# Patient Record
Sex: Female | Born: 1975 | Race: White | Hispanic: No | Marital: Married | Smoking: Never smoker
Health system: Southern US, Community
[De-identification: ages and names within clinical notes are randomized; demographics above are authoritative.]

## PROBLEM LIST (undated history)

## (undated) DIAGNOSIS — E079 Disorder of thyroid, unspecified: Secondary | ICD-10-CM

## (undated) DIAGNOSIS — E7800 Pure hypercholesterolemia, unspecified: Secondary | ICD-10-CM

## (undated) DIAGNOSIS — R030 Elevated blood-pressure reading, without diagnosis of hypertension: Secondary | ICD-10-CM

## (undated) DIAGNOSIS — T7840XA Allergy, unspecified, initial encounter: Secondary | ICD-10-CM

## (undated) DIAGNOSIS — F419 Anxiety disorder, unspecified: Secondary | ICD-10-CM

## (undated) DIAGNOSIS — I10 Essential (primary) hypertension: Secondary | ICD-10-CM

## (undated) DIAGNOSIS — G4730 Sleep apnea, unspecified: Secondary | ICD-10-CM

## (undated) DIAGNOSIS — J302 Other seasonal allergic rhinitis: Secondary | ICD-10-CM

## (undated) DIAGNOSIS — K635 Polyp of colon: Secondary | ICD-10-CM

## (undated) DIAGNOSIS — G8929 Other chronic pain: Secondary | ICD-10-CM

## (undated) DIAGNOSIS — K219 Gastro-esophageal reflux disease without esophagitis: Secondary | ICD-10-CM

## (undated) DIAGNOSIS — J45909 Unspecified asthma, uncomplicated: Secondary | ICD-10-CM

## (undated) DIAGNOSIS — R002 Palpitations: Secondary | ICD-10-CM

## (undated) DIAGNOSIS — D219 Benign neoplasm of connective and other soft tissue, unspecified: Secondary | ICD-10-CM

## (undated) DIAGNOSIS — J309 Allergic rhinitis, unspecified: Secondary | ICD-10-CM

## (undated) DIAGNOSIS — K5790 Diverticulosis of intestine, part unspecified, without perforation or abscess without bleeding: Secondary | ICD-10-CM

## (undated) DIAGNOSIS — R011 Cardiac murmur, unspecified: Secondary | ICD-10-CM

## (undated) DIAGNOSIS — D649 Anemia, unspecified: Secondary | ICD-10-CM

## (undated) DIAGNOSIS — T783XXA Angioneurotic edema, initial encounter: Secondary | ICD-10-CM

## (undated) DIAGNOSIS — R519 Headache, unspecified: Secondary | ICD-10-CM

## (undated) DIAGNOSIS — R51 Headache: Secondary | ICD-10-CM

## (undated) DIAGNOSIS — K589 Irritable bowel syndrome without diarrhea: Secondary | ICD-10-CM

## (undated) DIAGNOSIS — Z860101 Personal history of adenomatous and serrated colon polyps: Secondary | ICD-10-CM

## (undated) DIAGNOSIS — Z86010 Personal history of colonic polyps: Secondary | ICD-10-CM

## (undated) DIAGNOSIS — L509 Urticaria, unspecified: Secondary | ICD-10-CM

## (undated) HISTORY — DX: Polyp of colon: K63.5

## (undated) HISTORY — DX: Gastro-esophageal reflux disease without esophagitis: K21.9

## (undated) HISTORY — DX: Essential (primary) hypertension: I10

## (undated) HISTORY — DX: Headache: R51

## (undated) HISTORY — DX: Headache, unspecified: R51.9

## (undated) HISTORY — DX: Anemia, unspecified: D64.9

## (undated) HISTORY — DX: Elevated blood-pressure reading, without diagnosis of hypertension: R03.0

## (undated) HISTORY — DX: Allergy, unspecified, initial encounter: T78.40XA

## (undated) HISTORY — DX: Irritable bowel syndrome, unspecified: K58.9

## (undated) HISTORY — DX: Palpitations: R00.2

## (undated) HISTORY — DX: Anxiety disorder, unspecified: F41.9

## (undated) HISTORY — DX: Diverticulosis of intestine, part unspecified, without perforation or abscess without bleeding: K57.90

## (undated) HISTORY — DX: Cardiac murmur, unspecified: R01.1

## (undated) HISTORY — DX: Urticaria, unspecified: L50.9

## (undated) HISTORY — DX: Angioneurotic edema, initial encounter: T78.3XXA

## (undated) HISTORY — DX: Personal history of colonic polyps: Z86.010

## (undated) HISTORY — PX: WISDOM TOOTH EXTRACTION: SHX21

## (undated) HISTORY — DX: Other seasonal allergic rhinitis: J30.2

## (undated) HISTORY — DX: Benign neoplasm of connective and other soft tissue, unspecified: D21.9

## (undated) HISTORY — DX: Unspecified asthma, uncomplicated: J45.909

## (undated) HISTORY — DX: Other chronic pain: G89.29

## (undated) HISTORY — DX: Sleep apnea, unspecified: G47.30

## (undated) HISTORY — DX: Personal history of adenomatous and serrated colon polyps: Z86.0101

## (undated) HISTORY — DX: Allergic rhinitis, unspecified: J30.9

---

## 1998-06-26 ENCOUNTER — Encounter: Payer: Self-pay | Admitting: Family Medicine

## 1998-06-26 ENCOUNTER — Ambulatory Visit (HOSPITAL_COMMUNITY): Admission: RE | Admit: 1998-06-26 | Discharge: 1998-06-26 | Payer: Self-pay | Admitting: Family Medicine

## 1998-12-23 ENCOUNTER — Other Ambulatory Visit: Admission: RE | Admit: 1998-12-23 | Discharge: 1998-12-23 | Payer: Self-pay | Admitting: Obstetrics and Gynecology

## 1999-09-02 ENCOUNTER — Emergency Department (HOSPITAL_COMMUNITY): Admission: EM | Admit: 1999-09-02 | Discharge: 1999-09-02 | Payer: Self-pay | Admitting: Emergency Medicine

## 1999-09-02 ENCOUNTER — Encounter: Payer: Self-pay | Admitting: Emergency Medicine

## 2000-03-31 ENCOUNTER — Other Ambulatory Visit: Admission: RE | Admit: 2000-03-31 | Discharge: 2000-03-31 | Payer: Self-pay | Admitting: *Deleted

## 2000-12-07 ENCOUNTER — Encounter: Payer: Self-pay | Admitting: Family Medicine

## 2000-12-07 ENCOUNTER — Encounter: Admission: RE | Admit: 2000-12-07 | Discharge: 2000-12-07 | Payer: Self-pay | Admitting: Family Medicine

## 2001-04-09 ENCOUNTER — Other Ambulatory Visit: Admission: RE | Admit: 2001-04-09 | Discharge: 2001-04-09 | Payer: Self-pay | Admitting: Obstetrics and Gynecology

## 2001-06-15 ENCOUNTER — Ambulatory Visit (HOSPITAL_COMMUNITY): Admission: RE | Admit: 2001-06-15 | Discharge: 2001-06-15 | Payer: Self-pay | Admitting: Obstetrics & Gynecology

## 2002-05-10 ENCOUNTER — Other Ambulatory Visit: Admission: RE | Admit: 2002-05-10 | Discharge: 2002-05-10 | Payer: Self-pay | Admitting: Obstetrics & Gynecology

## 2003-03-12 ENCOUNTER — Other Ambulatory Visit: Admission: RE | Admit: 2003-03-12 | Discharge: 2003-03-12 | Payer: Self-pay | Admitting: Obstetrics and Gynecology

## 2003-03-27 ENCOUNTER — Ambulatory Visit (HOSPITAL_COMMUNITY): Admission: RE | Admit: 2003-03-27 | Discharge: 2003-03-27 | Payer: Self-pay | Admitting: Obstetrics & Gynecology

## 2003-05-23 ENCOUNTER — Inpatient Hospital Stay (HOSPITAL_COMMUNITY): Admission: AD | Admit: 2003-05-23 | Discharge: 2003-05-23 | Payer: Self-pay | Admitting: Obstetrics & Gynecology

## 2003-05-26 ENCOUNTER — Inpatient Hospital Stay (HOSPITAL_COMMUNITY): Admission: AD | Admit: 2003-05-26 | Discharge: 2003-06-02 | Payer: Self-pay | Admitting: Obstetrics & Gynecology

## 2004-03-17 ENCOUNTER — Other Ambulatory Visit: Admission: RE | Admit: 2004-03-17 | Discharge: 2004-03-17 | Payer: Self-pay | Admitting: Obstetrics and Gynecology

## 2004-10-06 ENCOUNTER — Other Ambulatory Visit: Admission: RE | Admit: 2004-10-06 | Discharge: 2004-10-06 | Payer: Self-pay | Admitting: Obstetrics and Gynecology

## 2005-02-24 ENCOUNTER — Observation Stay (HOSPITAL_COMMUNITY): Admission: AD | Admit: 2005-02-24 | Discharge: 2005-02-25 | Payer: Self-pay | Admitting: Obstetrics & Gynecology

## 2005-09-14 ENCOUNTER — Inpatient Hospital Stay (HOSPITAL_COMMUNITY): Admission: AD | Admit: 2005-09-14 | Discharge: 2005-09-17 | Payer: Self-pay | Admitting: Obstetrics and Gynecology

## 2007-06-29 ENCOUNTER — Ambulatory Visit: Payer: Self-pay | Admitting: Cardiology

## 2009-03-27 ENCOUNTER — Encounter: Admission: RE | Admit: 2009-03-27 | Discharge: 2009-03-27 | Payer: Self-pay | Admitting: Obstetrics and Gynecology

## 2010-06-12 ENCOUNTER — Encounter: Payer: Self-pay | Admitting: Obstetrics & Gynecology

## 2010-06-12 ENCOUNTER — Encounter: Payer: Self-pay | Admitting: Obstetrics and Gynecology

## 2011-03-29 DIAGNOSIS — F411 Generalized anxiety disorder: Secondary | ICD-10-CM | POA: Insufficient documentation

## 2012-02-07 ENCOUNTER — Other Ambulatory Visit: Payer: Self-pay | Admitting: Obstetrics and Gynecology

## 2012-02-07 DIAGNOSIS — N63 Unspecified lump in unspecified breast: Secondary | ICD-10-CM

## 2012-02-10 ENCOUNTER — Ambulatory Visit
Admission: RE | Admit: 2012-02-10 | Discharge: 2012-02-10 | Disposition: A | Discharge status: Home / Self Care | Payer: BC Managed Care – PPO | Source: Ambulatory Visit | Attending: Obstetrics and Gynecology | Admitting: Obstetrics and Gynecology

## 2012-02-10 DIAGNOSIS — N63 Unspecified lump in unspecified breast: Secondary | ICD-10-CM

## 2012-11-16 ENCOUNTER — Encounter: Payer: BC Managed Care – PPO | Admitting: Obstetrics and Gynecology

## 2012-12-13 ENCOUNTER — Encounter: Payer: BC Managed Care – PPO | Admitting: Obstetrics and Gynecology

## 2012-12-27 ENCOUNTER — Encounter: Payer: BC Managed Care – PPO | Admitting: Obstetrics and Gynecology

## 2013-01-09 ENCOUNTER — Encounter: Payer: Self-pay | Admitting: Obstetrics and Gynecology

## 2013-01-09 ENCOUNTER — Ambulatory Visit (INDEPENDENT_AMBULATORY_CARE_PROVIDER_SITE_OTHER): Payer: BC Managed Care – PPO | Admitting: Obstetrics and Gynecology

## 2013-01-09 VITALS — BP 128/80 | HR 70 | Ht 66.5 in | Wt 173.5 lb

## 2013-01-09 DIAGNOSIS — Z01419 Encounter for gynecological examination (general) (routine) without abnormal findings: Secondary | ICD-10-CM

## 2013-01-09 DIAGNOSIS — Z0000 Encounter for general adult medical examination without abnormal findings: Secondary | ICD-10-CM

## 2013-01-09 DIAGNOSIS — N920 Excessive and frequent menstruation with regular cycle: Secondary | ICD-10-CM

## 2013-01-09 LAB — POCT URINALYSIS DIPSTICK
Blood, UA: NEGATIVE
Glucose, UA: NEGATIVE
Urobilinogen, UA: NEGATIVE
pH, UA: 5

## 2013-01-09 MED ORDER — ETONOGESTREL-ETHINYL ESTRADIOL 0.12-0.015 MG/24HR VA RING: VAGINAL_RING | VAGINAL | Status: DC

## 2013-01-09 NOTE — Progress Notes (Signed)
Patient ID: Lynn Spears, female   DOB: 18-Oct-1975, 37 y.o.   MRN: [id number]  37 y.o.   Married    Caucasian   female   G1P1001   here for annual exam.   Went off NuvaRing for 6 months and tried for pregnancy but did not conceive. Still considering it.  Patient achieved pregnancy after had hysterosalpingogram in past.  Takes a multivitamin MDR for women.  Orders it online.    Menses are getting heavier.  Bleeds through a super tampon every three to four hours.  Some cramping, worse when she has a bowel movement.  Cramping is manageable.   Takes NSAIDs already during menses and this lessens the bleeding and cramping but lengthens the cycle.    Patient had a breast ultrasound and mammogram of the left breast for a breast lump. Told it was normal and recommended a mammogram at age 52 year old.  Patient states she still feels something there.    Notes a rash on her chest for one week. Did preschool screening the beginning of August.  Has a history of swollen glands periauricular.  Has had MRIs and scans which are normal.    Patient's last menstrual period was 12/16/2012.          Sexually active: yes  The current method of family planning is NuvaRing vaginal inserts.    Exercising: walking Last mammogram:  2013 wnl:[facility] Last pap smear: 10/2011 WNL and high risk HPV negative. History of abnormal pap: no Smoking: no Alcohol: 2 glasses of wine per week. Last colonoscopy: never Last Bone Density:  never Last tetanus shot: within last 10 years with PCP Last cholesterol check: 10/2011 wnl:[facility]  Hgb - 12.7            Urine: Neg   Family History  Problem Relation Age of Onset  . Hypertension Mother   . Ovarian cancer Maternal Aunt   . Diabetes Maternal Grandmother   . Stroke Maternal Grandmother     There are no active problems to display for this patient.   History reviewed. No pertinent past medical history.  History reviewed. No pertinent past surgical  history.  Allergies: Biaxin; Erythromycin; and Penicillins  Current Outpatient Prescriptions  Medication Sig Dispense Refill  . cetirizine (ZYRTEC) 10 MG tablet Take 10 mg by mouth daily.      Marland Kitchen etonogestrel-ethinyl estradiol (NUVARING) 0.12-0.015 MG/24HR vaginal ring Place 1 each vaginally every 28 (twenty-eight) days. Insert vaginally and leave in place for 3 consecutive weeks, then remove for 1 week.       No current facility-administered medications for this visit.    ROS: Pertinent items are noted in HPI.  Social Hx:  Married.  Environmental health practitioner.  One child - daughter - Cathlean Sauer.  Exam:    BP 128/80  Pulse 70  Ht 5' 6.5" (1.689 m)  Wt 173 lb 8 oz (78.699 kg)  BMI 27.59 kg/m2  LMP 12/16/2012   Wt Readings from Last 3 Encounters:  01/09/13 173 lb 8 oz (78.699 kg)     Ht Readings from Last 3 Encounters:  01/09/13 5' 6.5" (1.689 m)    General appearance: alert, cooperative and appears stated age Head: Normocephalic, without obvious abnormality, atraumatic Neck: no adenopathy, supple, symmetrical, trachea midline and thyroid not enlarged, symmetric, no tenderness/mass/nodules Lungs: clear to auscultation bilaterally Breasts: Inspection negative, No nipple retraction or dimpling, No nipple discharge or bleeding, No axillary or supraclavicular adenopathy, Normal to palpation without dominant masses Heart:  regular rate and rhythm Abdomen: soft, non-tender; no masses,  no organomegaly Extremities: extremities normal, atraumatic, no cyanosis or edema Skin: Skin color, texture, turgor normal. Patchy rash on chest.  Lymph nodes: Cervical, supraclavicular, and axillary nodes normal. No abnormal inguinal nodes palpated Neurologic: Grossly normal   Pelvic: External genitalia:  no lesions              Urethra:  normal appearing urethra with no masses, tenderness or lesions              Bartholins and Skenes: normal                 Vagina: normal appearing vagina with normal  color and discharge, no lesions              Cervix: normal appearance              Pap taken: no        Bimanual Exam:  Uterus:  uterus is normal size, shape, consistency and nontender.  Ring palpable in the vagina.                                       Adnexa: normal adnexa in size, nontender and no masses                                        Assessment  Normal gynecologic exam. Menorrhagia. Considering future fertility. AMA status.  Plan  Pap and HPV testing in 2018. Continue self breast exam. Hgb today. Use NuvaRing continually, changing every 3 weeks for three rings in a row.  Then have menses. If decide for pregnancy, start prenatal vitamins, stop NuvaRing and prevent pregnancy for at least 2 months before trying for preganancy, and consider using the LH ovulation kits along with timed intercourse. Discussed AMA status with patient and the implications of delaying trying for future fertility - increased risk of aneuploidy, increasing difficulty with conception, gestational diabetes, hypertension. Next annual exam in one year.   An After Visit Summary was printed and given to the patient.

## 2013-01-09 NOTE — Patient Instructions (Signed)

## 2013-10-23 ENCOUNTER — Telehealth: Payer: Self-pay | Admitting: Obstetrics and Gynecology

## 2013-10-23 NOTE — Telephone Encounter (Signed)
Message left to return call to Kita at [phone].    

## 2013-10-23 NOTE — Telephone Encounter (Signed)
Patient is having some pain and is requesting an appointment with Dr.Silva.

## 2013-10-23 NOTE — Telephone Encounter (Signed)
Spoke with patient. She states she has a new area of pain near urethra that patient describes as a "white open ulcer area." Noticed it with pain at area after intercourse on Sunday.  Using sitz bath at home with warm salt water without relief. Denies fevers or vaginal discharge. Patient requests appointment with Dr. Quincy Simmonds. Given first available appointment with Dr. Quincy Simmonds for 10/24/13 at 2:30. Patient is agreeable. She will call back if symptoms change or if worsens suggested urgent care or call back to our office.   Routing to provider for final review. Patient agreeable to disposition. Will close encounter

## 2013-10-24 ENCOUNTER — Ambulatory Visit (INDEPENDENT_AMBULATORY_CARE_PROVIDER_SITE_OTHER): Payer: BC Managed Care – PPO | Admitting: Obstetrics and Gynecology

## 2013-10-24 ENCOUNTER — Telehealth: Payer: Self-pay | Admitting: Obstetrics and Gynecology

## 2013-10-24 ENCOUNTER — Encounter: Payer: Self-pay | Admitting: Obstetrics and Gynecology

## 2013-10-24 VITALS — BP 122/80 | HR 70 | Ht 67.0 in | Wt 177.0 lb

## 2013-10-24 DIAGNOSIS — Z309 Encounter for contraceptive management, unspecified: Secondary | ICD-10-CM

## 2013-10-24 DIAGNOSIS — N766 Ulceration of vulva: Secondary | ICD-10-CM

## 2013-10-24 DIAGNOSIS — Z0000 Encounter for general adult medical examination without abnormal findings: Secondary | ICD-10-CM

## 2013-10-24 LAB — POCT URINALYSIS DIPSTICK
Bilirubin, UA: NEGATIVE
GLUCOSE UA: NEGATIVE
Ketones, UA: NEGATIVE
Leukocytes, UA: NEGATIVE
NITRITE UA: NEGATIVE
PROTEIN UA: NEGATIVE
RBC UA: NEGATIVE
UROBILINOGEN UA: NEGATIVE
pH, UA: 5

## 2013-10-24 MED ORDER — NORETHINDRONE 0.35 MG PO TABS: 1.0000 | ORAL_TABLET | Freq: Every day | ORAL | Status: DC

## 2013-10-24 MED ORDER — LIDOCAINE HCL 2 % EX GEL: 1.0000 "application " | Freq: Three times a day (TID) | CUTANEOUS | Status: DC

## 2013-10-24 MED ORDER — VALACYCLOVIR HCL 1 G PO TABS: 1000.0000 mg | ORAL_TABLET | Freq: Two times a day (BID) | ORAL | Status: DC

## 2013-10-24 NOTE — Telephone Encounter (Signed)
Spoke with patient. She feels she is healing and still using sitz bath with epsom salts but not "100%" Better. I advised patient that it is here choice to keep appointment or not, but as she described symptoms yesterday to me, I feel it is appropriate for appointment. Patient states she will keep appointment and she would like to be examined and know what is causing pain.   Appointment on schedule remains.   Routing to provider for final review. Patient agreeable to disposition. Will close encounter

## 2013-10-24 NOTE — Telephone Encounter (Signed)
Patient has an appointment today at 2:30 with Dr.Silva. Patient thinks she may not need this appointment. Patient says "It seems to getting better". Patient wants to schedule for next week then call and cancel if she feels better. I did not cancel her appointment today. I told her a triage nurse would need to call her back.

## 2013-10-24 NOTE — Progress Notes (Signed)
Patient ID: Lynn Spears, female   DOB: May 15, 1976, 38 y.o.   MRN: [id number] GYNECOLOGY VISIT  PCP:   Dr. Quintella Baton, [place]  Referring provider:   HPI: 38 y.o.   Married  Caucasian  female   G1P1001 with Patient's last menstrual period was 10/10/2013.   here for vulvar ulcer.   Painful intercourse starting last weekend.  Partner saw an ulcerative area near the clitoris.  Placing Epsom salts and feels better.  Using Aquafor vaseline like ointment. Never happened before.  Some discharge. No itching.  No odor.  No flu like symptoms.  New detergent change one month ago.  Does have a history of cold sores.   Had an episode of trigeminal neuralgia many years ago and the etiology was thought to be viral.  Received Rx or acyclovir.   Seen gastroenterologist for GI issues and blood pressure was noted to be elevated repeatedly. Colonoscopy not done yet.  BP 154/104. Had a full work up, and was placed on Atenolol. Will see Cardiologist on 11/05/13. Stopped NuvaRing due to elevated blood pressure.  Took Micronor in the past without problems.  May need to return to this.   Urine:  Neg  GYNECOLOGIC HISTORY: Patient's last menstrual period was 10/10/2013. Sexually active: yes Partner preference: female Contraception:  Abstinence--stopped Nuvaring recently due to elevated B/P  Menopausal hormone therapy: n/a DES exposure:  no Blood transfusions:  no Sexually transmitted diseases:  no  GYN procedures and prior surgeries:  no Last mammogram: Diagnostic MMG of Left breast 02-10-12 revealed dense, asymmestrical breast but no masses--Ultrasound confirmed.  Recommend screening at age 60.             Last pap and high risk HPV testing:   10/2011 wnl:neg HR HPV History of abnormal pap smear:  no   OB History   Grav Para Term Preterm Abortions TAB SAB Ect Mult Living   1 1 1       1        LIFESTYLE: Exercise:  no             Tobacco:  no Alcohol:    1-2 drinks per week Drug use:   no  OTHER HEALTH MAINTENANCE: Tetanus/TDap:  unsure Gardisil:              n/a Influenza:            02/2013 Zostavax:             n/a  Bone density:      n/a Colonoscopy:      n/a  Cholesterol check:  Normal with PCP  Family History  Problem Relation Age of Onset  . Hypertension Mother   . Ovarian cancer Maternal Aunt   . Diabetes Maternal Grandmother   . Stroke Maternal Grandmother     There are no active problems to display for this patient.  Past Medical History  Diagnosis Date  . Anemia   . Anxiety   . Hypertension     History reviewed. No pertinent past surgical history.  ALLERGIES: Biaxin; Erythromycin; and Penicillins  Current Outpatient Prescriptions  Medication Sig Dispense Refill  . atenolol (TENORMIN) 25 MG tablet Take 25 mg by mouth daily.      . cetirizine (ZYRTEC) 10 MG tablet Take 10 mg by mouth daily.      Marland Kitchen etonogestrel-ethinyl estradiol (NUVARING) 0.12-0.015 MG/24HR vaginal ring Insert vaginally and leave in place for 3 consecutive weeks, for three rings in a row.  Then have a  cycle for one week.  3 each  4  . polyethylene glycol-electrolytes (NULYTELY/GOLYTELY) 420 G solution Take 1 L by mouth as needed.       No current facility-administered medications for this visit.     ROS:  Pertinent items are noted in HPI.  SOCIAL HISTORY:  Married.   PHYSICAL EXAMINATION:    BP 122/80  Pulse 70  Ht 5\' 7"  (1.702 m)  Wt 177 lb (80.287 kg)  BMI 27.72 kg/m2  LMP 10/10/2013   Wt Readings from Last 3 Encounters:  10/24/13 177 lb (80.287 kg)  01/09/13 173 lb 8 oz (78.699 kg)     Ht Readings from Last 3 Encounters:  10/24/13 5\' 7"  (1.702 m)  01/09/13 5' 6.5" (1.689 m)    General appearance: alert, cooperative and appears stated age   Pelvic: External genitalia:  5 mm ulcer to the left of the clitoris, very tender to the touch.               Urethra:  normal appearing urethra with no masses, tenderness or lesions              Bartholins and  Skenes: normal                 Vagina: normal appearing vagina with normal color and discharge, no lesions              Cervix: normal appearance            Bimanual Exam:  Uterus:  uterus is normal size, shape, consistency and nontender                                      Adnexa: normal adnexa in size, nontender and no masses                                       ASSESSMENT  Vulvar ulcer, suspicious for HSV infection.  History of HSV I. Hypertension - new diagnosis.  Need for contraception.   PLAN  HSV culture performed.  Discussion with patient of potential HSV of the vulva.  May have autoinoculated.  Valtrex 500 mg po bid for 10 days.  Xylocaine jelly 2% to area tid prn pain.  Micronor for 3 months.  Return for annual exam in 2 months.   An After Visit Summary was printed and given to the patient.

## 2013-10-28 LAB — HERPES SIMPLEX VIRUS CULTURE: Organism ID, Bacteria: NOT DETECTED

## 2013-10-29 ENCOUNTER — Telehealth: Payer: Self-pay | Admitting: Emergency Medicine

## 2013-10-29 NOTE — Telephone Encounter (Signed)
Spoke with patient and she is relieved that culture is negative. Advised is symptoms recur to call back for appointment with Dr. Quincy Simmonds. She is agreeable.   Routing to provider for final review. Patient agreeable to disposition. Will close encounter

## 2013-10-29 NOTE — Telephone Encounter (Signed)
Message copied by Michele Mcalpine on Tue Oct 29, 2013  9:48 AM ------      Message from: [place]ueblitos, BROOK E      Created: Tue Oct 29, 2013  7:29 AM       Please inform patient of her negative HSV culture. ------

## 2013-11-05 ENCOUNTER — Encounter: Payer: Self-pay | Admitting: Cardiology

## 2013-11-05 ENCOUNTER — Encounter: Payer: Self-pay | Admitting: *Deleted

## 2013-11-05 ENCOUNTER — Telehealth: Payer: Self-pay | Admitting: Cardiology

## 2013-11-05 ENCOUNTER — Ambulatory Visit (INDEPENDENT_AMBULATORY_CARE_PROVIDER_SITE_OTHER): Payer: BC Managed Care – PPO | Admitting: Cardiology

## 2013-11-05 VITALS — BP 112/78 | HR 74 | Ht 67.0 in | Wt 175.4 lb

## 2013-11-05 DIAGNOSIS — R030 Elevated blood-pressure reading, without diagnosis of hypertension: Secondary | ICD-10-CM

## 2013-11-05 DIAGNOSIS — R0602 Shortness of breath: Secondary | ICD-10-CM

## 2013-11-05 DIAGNOSIS — IMO0001 Reserved for inherently not codable concepts without codable children: Secondary | ICD-10-CM | POA: Insufficient documentation

## 2013-11-05 DIAGNOSIS — R002 Palpitations: Secondary | ICD-10-CM | POA: Insufficient documentation

## 2013-11-05 DIAGNOSIS — Z8249 Family history of ischemic heart disease and other diseases of the circulatory system: Secondary | ICD-10-CM | POA: Insufficient documentation

## 2013-11-05 NOTE — Assessment & Plan Note (Signed)
Noted in both parents. Baseline ECG is normal. Since she has had elevation in her blood pressure and also some palpitations with mild shortness of breath, we will obtain a baseline GXT off beta blocker.

## 2013-11-05 NOTE — Telephone Encounter (Signed)
No precert required 

## 2013-11-05 NOTE — Progress Notes (Signed)
Clinical Summary  Lynn Spears is a 38 y.o.female referred for cardiology consultation by Dr. Pleas Koch. She reports a feeling of palpitations at times, seems to be more of a forceful or rapid heartbeat. This has occurred in the setting of her fairly recently documented elevated blood pressure over the last several weeks. She does not endorse any exertional chest pain, mild shortness of breath when she exercises. She has had no dizziness or syncope.  ECG today shows normal sinus rhythm.  She reportedly underwent workup by [facility] approximately 5 years ago for palpitations, wore a monitor at that time and had an echocardiogram. She recalls being told that the studies were largely normal with the exception of a few "early beats" by monitor. She was offered as necessary beta blocker, but did not take any specific medications then.  She reports having some difficulties with "digestive issues" that are being looked into, also anxiety. She also has a family history of early heart disease as noted below.  Recent lab work showed normal TSH 1.1, BUN 8, creatinine 0.7, potassium 4.6, normal LFTs. Not certain about lipid status.  States she has gained 10 pounds over the last year.   Allergies  Allergen Reactions  . Biaxin [Clarithromycin] Other (See Comments)    GI upset  . Erythromycin     GI upset  . Penicillins Rash    Current Outpatient Prescriptions  Medication Sig Dispense Refill  . atenolol (TENORMIN) 25 MG tablet Take 25 mg by mouth daily.      . cetirizine (ZYRTEC) 10 MG tablet Take 10 mg by mouth daily.      . norethindrone (MICRONOR,CAMILA,ERRIN) 0.35 MG tablet Take 1 tablet (0.35 mg total) by mouth daily.  1 Package  2   No current facility-administered medications for this visit.    Past Medical History  Diagnosis Date  . Anxiety   . Elevated blood pressure   . Palpitations   . Allergic rhinitis     History reviewed. No pertinent past surgical history.  Family History    Problem Relation Age of Onset  . Hypertension Mother   . Ovarian cancer Maternal Aunt   . Diabetes Maternal Grandmother   . Stroke Maternal Grandmother   . CAD Father     CABG in his late 42s  . CAD Mother     CABG at age 46    Social History Lynn Spears reports that she has never smoked. She does not have any smokeless tobacco history on file. Lynn Spears reports that she drinks about one ounce of alcohol per week.  Review of Systems Reports problems with allergies. No dizziness or syncope. No orthopnea or PND. No claudication. Has had some blood in her stool by report. Otherwise reviewed and negative  Physical Examination Filed Vitals:   11/05/13 0846  BP: 112/78  Pulse: 74   Filed Weights   11/05/13 0846  Weight: 175 lb 6.4 oz (79.561 kg)   Patient appears comfortable at rest. HEENT: Conjunctiva and lids normal, oropharynx clear. Neck: Supple, no elevated JVP or carotid bruits, no thyromegaly. Lungs: Clear to auscultation, nonlabored breathing at rest. Cardiac: Regular rate and rhythm, no S3 or significant systolic murmur, no pericardial rub. Abdomen: Soft, nontender, bowel sounds present, no guarding or rebound. Extremities: No pitting edema, distal pulses 2+. Skin: Warm and dry. Musculoskeletal: No kyphosis. Neuropsychiatric: Alert and oriented x3, affect grossly appropriate.   Problem List and Plan   Palpitations Noted intermittently as outlined, not associated with dizziness  or syncope. She has had monitoring in the past that showed presumably either PACs or PVCs based on her description, I do not have the report. Baseline ECG is normal today. She reports improved symptoms on beta blocker. I recommended healthy diet, restricting caffeine, and regular exercise. Otherwise observation for now.  Family history of premature CAD Noted in both parents. Baseline ECG is normal. Since she has had elevation in her blood pressure and also some palpitations with mild shortness  of breath, we will obtain a baseline GXT off beta blocker.  Elevated blood pressure Recommended healthy diet and exercise, try and get back to her prior weight by losing 10 pounds. Sodium restriction also discussed. I suspect that she may be able to get off of atenolol with these measures.    Satira Sark, M.D., F.A.C.C.

## 2013-11-05 NOTE — Assessment & Plan Note (Addendum)
Noted intermittently as outlined, not associated with dizziness or syncope. She has had monitoring in the past that showed presumably either PACs or PVCs based on her description, I do not have the report. Baseline ECG is normal today. She reports improved symptoms on beta blocker. I recommended healthy diet, restricting caffeine, and regular exercise. Otherwise observation for now.

## 2013-11-05 NOTE — Assessment & Plan Note (Signed)
Recommended healthy diet and exercise, try and get back to her prior weight by losing 10 pounds. Sodium restriction also discussed. I suspect that she may be able to get off of atenolol with these measures.

## 2013-11-05 NOTE — Telephone Encounter (Signed)
Precert for Treadmill schedule on June 23 at [facility]

## 2013-11-05 NOTE — Patient Instructions (Signed)
Your physician recommends that you continue on your current medications as directed. Please refer to the Current Medication list given to you today. Your physician has requested that you have an exercise tolerance test. For further information please visit [email or web address]. Please also follow instruction sheet, as given. We will call you with your results.

## 2013-11-12 ENCOUNTER — Encounter (HOSPITAL_COMMUNITY): Payer: Self-pay

## 2013-11-12 ENCOUNTER — Ambulatory Visit (HOSPITAL_COMMUNITY)
Admission: RE | Admit: 2013-11-12 | Discharge: 2013-11-12 | Disposition: A | Discharge status: Home / Self Care | Payer: BC Managed Care – PPO | Source: Ambulatory Visit | Attending: Cardiology | Admitting: Cardiology

## 2013-11-12 DIAGNOSIS — R0602 Shortness of breath: Secondary | ICD-10-CM | POA: Insufficient documentation

## 2013-11-12 DIAGNOSIS — R002 Palpitations: Secondary | ICD-10-CM | POA: Insufficient documentation

## 2013-11-12 NOTE — Progress Notes (Addendum)
Stress Lab Nurses Notes - [place] 11/12/2013 Reason for doing test: palpitation & SOB Type of test: Regular GTX Nurse performing test: Gerrit Halls, RN Nuclear Medicine Tech: Not Applicable Echo Tech: Not Applicable MD performing test: Koneswaran/K.Lawrence NP Family MD: Burdine Test explained and consent signed: yes IV started: No IV started Symptoms: none Treatment/Intervention: None Reason test stopped: fatigue and reached target HR After recovery IV was: na Patient to return to [place]. Med at : NA Patient discharged: Home Patient's Condition upon discharge was: stable Comments: During test peak BP 181/89 & HR 169.  Recovery BP 154/89 & HR 96.  Symptoms resolved in recovery.  [name]  ATTENDING ADDENDUM: Baseline tracing demonstrated normal sinus rhythm, HR 78 bpm. With exercise, there were no diagnostic ST segment or T wave abnormalities, nor any arrhythmias. Duke treadmill score of 8.5, indicating a low risk for major cardiac events.

## 2013-12-31 DIAGNOSIS — R42 Dizziness and giddiness: Secondary | ICD-10-CM | POA: Insufficient documentation

## 2013-12-31 DIAGNOSIS — K819 Cholecystitis, unspecified: Secondary | ICD-10-CM | POA: Insufficient documentation

## 2014-01-10 ENCOUNTER — Ambulatory Visit: Payer: BC Managed Care – PPO | Admitting: Obstetrics and Gynecology

## 2014-01-28 ENCOUNTER — Telehealth: Payer: Self-pay | Admitting: Cardiology

## 2014-01-28 DIAGNOSIS — R002 Palpitations: Secondary | ICD-10-CM

## 2014-01-28 NOTE — Telephone Encounter (Signed)
Patient continues to have palpitations. Saw Dr. Pleas Koch two weeks ago. She states that she received a telephone call yesterday from Dr. Pleas Koch. He told her to contact Our office to get an event monitor. Lynn Spears was confused as to why the doctor told her to contact our office.

## 2014-01-28 NOTE — Telephone Encounter (Signed)
Spoke with patient and informed her that Dr. Lizbeth Bark office needed to contact us directly with their request. Patient said she would call them now and have them send over their request.

## 2014-01-28 NOTE — Telephone Encounter (Signed)
Patient needs to be called at work.

## 2014-01-29 ENCOUNTER — Ambulatory Visit: Payer: BC Managed Care – PPO | Admitting: Obstetrics and Gynecology

## 2014-01-29 ENCOUNTER — Other Ambulatory Visit: Payer: Self-pay | Admitting: *Deleted

## 2014-01-29 DIAGNOSIS — R002 Palpitations: Secondary | ICD-10-CM

## 2014-01-29 NOTE — Telephone Encounter (Addendum)
Duration clarification from [place] office is for 21 days.

## 2014-01-29 NOTE — Telephone Encounter (Addendum)
Order for event monitor sent to the office from Dr. Lizbeth Bark office for an event monitor for [id number] and palpitations. There was no duration on order. Made an attempt to call their office to get clarification. Please advise if this is okay for [place] to order.

## 2014-01-29 NOTE — Addendum Note (Signed)
Addended by: Merlene Laughter on: 01/29/2014 03:09 PM   Modules accepted: Orders

## 2014-02-09 DIAGNOSIS — R002 Palpitations: Secondary | ICD-10-CM

## 2014-03-24 ENCOUNTER — Encounter: Payer: Self-pay | Admitting: Cardiology

## 2014-09-10 ENCOUNTER — Ambulatory Visit (INDEPENDENT_AMBULATORY_CARE_PROVIDER_SITE_OTHER): Payer: BC Managed Care – PPO | Admitting: Obstetrics and Gynecology

## 2014-09-10 ENCOUNTER — Encounter: Payer: Self-pay | Admitting: Obstetrics and Gynecology

## 2014-09-10 VITALS — BP 130/78 | HR 76 | Resp 16 | Ht 67.0 in | Wt 177.0 lb

## 2014-09-10 DIAGNOSIS — Z01419 Encounter for gynecological examination (general) (routine) without abnormal findings: Secondary | ICD-10-CM

## 2014-09-10 DIAGNOSIS — R8290 Unspecified abnormal findings in urine: Secondary | ICD-10-CM

## 2014-09-10 DIAGNOSIS — N63 Unspecified lump in breast: Secondary | ICD-10-CM

## 2014-09-10 DIAGNOSIS — N6320 Unspecified lump in the left breast, unspecified quadrant: Secondary | ICD-10-CM

## 2014-09-10 DIAGNOSIS — Z0000 Encounter for general adult medical examination without abnormal findings: Secondary | ICD-10-CM | POA: Diagnosis not present

## 2014-09-10 LAB — POCT URINALYSIS DIPSTICK
BILIRUBIN UA: NEGATIVE
Blood, UA: NEGATIVE
Glucose, UA: NEGATIVE
KETONES UA: NEGATIVE
LEUKOCYTES UA: NEGATIVE
Nitrite, UA: NEGATIVE
PROTEIN UA: NEGATIVE
Urobilinogen, UA: NEGATIVE
pH, UA: 6

## 2014-09-10 NOTE — Progress Notes (Signed)
39 y.o. G1P1001 MarriedCaucasianF here for annual exam.    Left breast lumpiness and tenderness.  Has fibrocystic change.  Last mammogram was in 2103 - normal.   Stopped Camilla.  Decided to avoid hormonal exposure.   Menses are more heavy for her. Advil helps. Uses heating pad also.   Still on blood pressure medication.  Now having urgency to void and pressure.  Some abdominal soreness also which she associates with UTI.  Did have UTI in January.   Sees PCP in June for labs.   Saw cardiology for palpitations.  Normal EKG and stress test.  Patient's last menstrual period was 08/26/2014 (approximate).          Sexually active: Yes.    The current method of family planning is condoms sometimes and abstinence.    Exercising: Yes.    Walking Smoker:  no  Health Maintenance: Pap:  10/2011 Neg. HR HPV:neg History of abnormal Pap:  no MMG:  [place] Left 02/10/2012 BIRADS1:Neg. Repeat at age 70. Self Breast Exam: yes, every few months. TDaP: will check with PCP Screening Labs: PCP, Hb today: PCP, Urine today: Negative   reports that she has never smoked. She has never used smokeless tobacco. She reports that she drinks about 1.0 oz of alcohol per week. She reports that she does not use illicit drugs.  Past Medical History  Diagnosis Date  . Anxiety   . Elevated blood pressure   . Palpitations   . Allergic rhinitis     History reviewed. No pertinent past surgical history.  Current Outpatient Prescriptions  Medication Sig Dispense Refill  . cetirizine (ZYRTEC) 10 MG tablet Take 10 mg by mouth daily.    . metoprolol succinate (TOPROL-XL) 25 MG 24 hr tablet Take 12.5 mg by mouth daily.     . montelukast (SINGULAIR) 10 MG tablet Take 1 tablet by mouth daily.     No current facility-administered medications for this visit.    Family History  Problem Relation Age of Onset  . Hypertension Mother   . Ovarian cancer Maternal Aunt   . Diabetes Maternal Grandmother   . Stroke  Maternal Grandmother   . CAD Father     CABG in his late 43s  . CAD Mother     CABG at age 26    ROS:  Pertinent items are noted in HPI.  Otherwise, a comprehensive ROS was negative.  Exam:   BP 130/78 mmHg  Pulse 76  Resp 16  Ht 5\' 7"  (1.702 m)  Wt 177 lb (80.287 kg)  BMI 27.72 kg/m2  LMP 08/26/2014 (Approximate)     Height: 5\' 7"  (170.2 cm)  Ht Readings from Last 3 Encounters:  09/10/14 5\' 7"  (1.702 m)  11/05/13 5\' 7"  (1.702 m)  10/24/13 5\' 7"  (1.702 m)    General appearance: alert, cooperative and appears stated age Head: Normocephalic, without obvious abnormality, atraumatic Neck: no adenopathy, supple, symmetrical, trachea midline and thyroid normal to inspection and palpation Lungs: clear to auscultation bilaterally Breasts: normal appearance, no masses or tenderness, Inspection negative, No nipple retraction or dimpling, No nipple discharge or bleeding, No axillary or supraclavicular adenopathy on right.  Left breast with ridge at 1 - 2:00.  No retractions, nippel D/C or axillary nodes.  Has a 0.75 cm mold in the same general area.  Heart: regular rate and rhythm Abdomen: soft, non-tender; bowel sounds normal; no masses,  no organomegaly Extremities: extremities normal, atraumatic, no cyanosis or edema Skin: Skin color, texture, turgor normal.  No rashes or lesions Lymph nodes: Cervical, supraclavicular, and axillary nodes normal. No abnormal inguinal nodes palpated Neurologic: Grossly normal   Pelvic: External genitalia:  no lesions              Urethra:  normal appearing urethra with no masses, tenderness or lesions              Bartholins and Skenes: normal                 Vagina: normal appearing vagina with normal color and discharge, no lesions              Cervix: no lesions              Pap taken: Yes.   Bimanual Exam:  Uterus:  normal size, contour, position, consistency, mobility, non-tender              Adnexa: normal adnexa and no mass, fullness,  tenderness               Rectovaginal: Confirms               Anus:  normal sphincter tone, no lesions  Chaperone was present for exam.  A:  Well Woman with normal exam Left breast ridge.  Menorrhagia and dysmenorrhea.  Off Camilla.  Urine odor and urgency. Urine dip negative.   P:   Bilateral diagnostic mammogram and left breast ultrasound.  pap smear and HR HPV testing.  Discussion about Mirena.  Info to patient.  Urine culture.  Labs with PCP.   return annually or prn

## 2014-09-10 NOTE — Progress Notes (Signed)
Patient is scheduled for Bilateral Breast Diagnostic Mammogram and L Breast Ultrasound at [facility] on 09/12/14 at 1020 . Patient agreeable to time/date/location.

## 2014-09-10 NOTE — Patient Instructions (Signed)
EXERCISE AND DIET:  We recommended that you start or continue a regular exercise program for good health. Regular exercise means any activity that makes your heart beat faster and makes you sweat.  We recommend exercising at least 30 minutes per day at least 3 days a week, preferably 4 or 5.  We also recommend a diet low in fat and sugar.  Inactivity, poor dietary choices and obesity can cause diabetes, heart attack, stroke, and kidney damage, among others.    ALCOHOL AND SMOKING:  Women should limit their alcohol intake to no more than 7 drinks/beers/glasses of wine (combined, not each!) per week. Moderation of alcohol intake to this level decreases your risk of breast cancer and liver damage. And of course, no recreational drugs are part of a healthy lifestyle.  And absolutely no smoking or even second hand smoke. Most people know smoking can cause heart and lung diseases, but did you know it also contributes to weakening of your bones? Aging of your skin?  Yellowing of your teeth and nails?  CALCIUM AND VITAMIN D:  Adequate intake of calcium and Vitamin D are recommended.  The recommendations for exact amounts of these supplements seem to change often, but generally speaking 600 mg of calcium (either carbonate or citrate) and 800 units of Vitamin D per day seems prudent. Certain women may benefit from higher intake of Vitamin D.  If you are among these women, your doctor will have told you during your visit.    PAP SMEARS:  Pap smears, to check for cervical cancer or precancers,  have traditionally been done yearly, although recent scientific advances have shown that most women can have pap smears less often.  However, every woman still should have a physical exam from her gynecologist every year. It will include a breast check, inspection of the vulva and vagina to check for abnormal growths or skin changes, a visual exam of the cervix, and then an exam to evaluate the size and shape of the uterus and  ovaries.  And after 38 years of age, a rectal exam is indicated to check for rectal cancers. We will also provide age appropriate advice regarding health maintenance, like when you should have certain vaccines, screening for sexually transmitted diseases, bone density testing, colonoscopy, mammograms, etc.   MAMMOGRAMS:  All women over 40 years old should have a yearly mammogram. Many facilities now offer a "3D" mammogram, which may cost around $50 extra out of pocket. If possible,  we recommend you accept the option to have the 3D mammogram performed.  It both reduces the number of women who will be called back for extra views which then turn out to be normal, and it is better than the routine mammogram at detecting truly abnormal areas.    COLONOSCOPY:  Colonoscopy to screen for colon cancer is recommended for all women at age 50.  We know, you hate the idea of the prep.  We agree, BUT, having colon cancer and not knowing it is worse!!  Colon cancer so often starts as a polyp that can be seen and removed at colonscopy, which can quite literally save your life!  And if your first colonoscopy is normal and you have no family history of colon cancer, most women don't have to have it again for 10 years.  Once every ten years, you can do something that may end up saving your life, right?  We will be happy to help you get it scheduled when you are ready.    Be sure to check your insurance coverage so you understand how much it will cost.  It may be covered as a preventative service at no cost, but you should check your particular policy.     Levonorgestrel intrauterine device (IUD) What is this medicine? LEVONORGESTREL IUD (LEE voe nor jes trel) is a contraceptive (birth control) device. The device is placed inside the uterus by a healthcare professional. It is used to prevent pregnancy and can also be used to treat heavy bleeding that occurs during your period. Depending on the device, it can be used for 3 to 5  years. This medicine may be used for other purposes; ask your health care provider or pharmacist if you have questions. COMMON BRAND NAME(S): Verda Cumins What should I tell my health care provider before I take this medicine? They need to know if you have any of these conditions: -abnormal Pap smear -cancer of the breast, uterus, or cervix -diabetes -endometritis -genital or pelvic infection now or in the past -have more than one sexual partner or your partner has more than one partner -heart disease -history of an ectopic or tubal pregnancy -immune system problems -IUD in place -liver disease or tumor -problems with blood clots or take blood-thinners -use intravenous drugs -uterus of unusual shape -vaginal bleeding that has not been explained -an unusual or allergic reaction to levonorgestrel, other hormones, silicone, or polyethylene, medicines, foods, dyes, or preservatives -pregnant or trying to get pregnant -breast-feeding How should I use this medicine? This device is placed inside the uterus by a health care professional. Talk to your pediatrician regarding the use of this medicine in children. Special care may be needed. Overdosage: If you think you have taken too much of this medicine contact a poison control center or emergency room at once. NOTE: This medicine is only for you. Do not share this medicine with others. What if I miss a dose? This does not apply. What may interact with this medicine? Do not take this medicine with any of the following medications: -amprenavir -bosentan -fosamprenavir This medicine may also interact with the following medications: -aprepitant -barbiturate medicines for inducing sleep or treating seizures -bexarotene -griseofulvin -medicines to treat seizures like carbamazepine, ethotoin, felbamate, oxcarbazepine, phenytoin, topiramate -modafinil -pioglitazone -rifabutin -rifampin -rifapentine -some medicines to treat HIV  infection like atazanavir, indinavir, lopinavir, nelfinavir, tipranavir, ritonavir -St. John's wort -warfarin This list may not describe all possible interactions. Give your health care provider a list of all the medicines, herbs, non-prescription drugs, or dietary supplements you use. Also tell them if you smoke, drink alcohol, or use illegal drugs. Some items may interact with your medicine. What should I watch for while using this medicine? Visit your doctor or health care professional for regular check ups. See your doctor if you or your partner has sexual contact with others, becomes HIV positive, or gets a sexual transmitted disease. This product does not protect you against HIV infection (AIDS) or other sexually transmitted diseases. You can check the placement of the IUD yourself by reaching up to the top of your vagina with clean fingers to feel the threads. Do not pull on the threads. It is a good habit to check placement after each menstrual period. Call your doctor right away if you feel more of the IUD than just the threads or if you cannot feel the threads at all. The IUD may come out by itself. You may become pregnant if the device comes out. If you notice that the IUD  has come out use a backup birth control method like condoms and call your health care provider. Using tampons will not change the position of the IUD and are okay to use during your period. What side effects may I notice from receiving this medicine? Side effects that you should report to your doctor or health care professional as soon as possible: -allergic reactions like skin rash, itching or hives, swelling of the face, lips, or tongue -fever, flu-like symptoms -genital sores -high blood pressure -no menstrual period for 6 weeks during use -pain, swelling, warmth in the leg -pelvic pain or tenderness -severe or sudden headache -signs of pregnancy -stomach cramping -sudden shortness of breath -trouble with  balance, talking, or walking -unusual vaginal bleeding, discharge -yellowing of the eyes or skin Side effects that usually do not require medical attention (report to your doctor or health care professional if they continue or are bothersome): -acne -breast pain -change in sex drive or performance -changes in weight -cramping, dizziness, or faintness while the device is being inserted -headache -irregular menstrual bleeding within first 3 to 6 months of use -nausea This list may not describe all possible side effects. Call your doctor for medical advice about side effects. You may report side effects to FDA at 1-800-FDA-1088. Where should I keep my medicine? This does not apply. NOTE: This sheet is a summary. It may not cover all possible information. If you have questions about this medicine, talk to your doctor, pharmacist, or health care provider.  2015, [employer]vier/Gold Standard. (2011-06-09 13:54:04)

## 2014-09-12 ENCOUNTER — Ambulatory Visit
Admission: RE | Admit: 2014-09-12 | Discharge: 2014-09-12 | Disposition: A | Discharge status: Home / Self Care | Payer: BC Managed Care – PPO | Source: Ambulatory Visit | Attending: Obstetrics and Gynecology | Admitting: Obstetrics and Gynecology

## 2014-09-12 DIAGNOSIS — N6320 Unspecified lump in the left breast, unspecified quadrant: Secondary | ICD-10-CM

## 2014-09-12 LAB — URINE CULTURE
Colony Count: NO GROWTH
ORGANISM ID, BACTERIA: NO GROWTH

## 2014-09-12 LAB — IPS PAP TEST WITH HPV

## 2014-09-15 ENCOUNTER — Telehealth: Payer: Self-pay | Admitting: Emergency Medicine

## 2014-09-15 NOTE — Telephone Encounter (Signed)
-----   Message from Nunzio Cobbs, MD sent at 09/12/2014 12:31 PM EDT ----- Please also let patient know that her urine culture [place] negative for infection.

## 2014-09-15 NOTE — Telephone Encounter (Signed)
Patient given messages from Dr. Quincy Simmonds.   Recheck for breast scheduled for 10/23/14 at 1430.   Routing to provider for final review. Patient agreeable to disposition. Will close encounter

## 2014-09-15 NOTE — Telephone Encounter (Signed)
-----   Message from Nunzio Cobbs, MD sent at 09/12/2014 12:31 PM EDT ----- Bilateral diagnostic mammogram and left breast ultrasound normal.  Please schedule a recheck appointment for me to see patient in 6 weeks.  Keep in hold until after office visit.

## 2014-10-23 ENCOUNTER — Telehealth: Payer: Self-pay | Admitting: Obstetrics and Gynecology

## 2014-10-23 ENCOUNTER — Ambulatory Visit: Payer: BC Managed Care – PPO | Admitting: Obstetrics and Gynecology

## 2014-10-23 NOTE — Telephone Encounter (Signed)
Patient missed her appointment today for "Breast Recheck". I left a message for patient to call and reschedule.

## 2014-10-23 NOTE — Telephone Encounter (Signed)
Thank you for calling the patient.  Patient should be in mammogram hold until she completes the breast recheck.   [place]

## 2014-10-27 NOTE — Telephone Encounter (Signed)
Message left to return call to Arisbeth at [phone].    

## 2014-11-13 ENCOUNTER — Encounter: Payer: Self-pay | Admitting: Emergency Medicine

## 2014-11-13 NOTE — Telephone Encounter (Signed)
Dr. Quincy Simmonds,  Attempts for contacting patient and rescheduling x 3.  Patient has not returned calls.  Okay to send letter?

## 2014-11-14 NOTE — Telephone Encounter (Signed)
Ok to send letter

## 2014-11-14 NOTE — Telephone Encounter (Signed)
Signed letter sent to patient's address on file. Patient currently in mammogram hold.  Routing to provider for final review. Patient agreeable to disposition. Will close encounter.

## 2014-12-15 ENCOUNTER — Telehealth: Payer: Self-pay | Admitting: Emergency Medicine

## 2014-12-15 NOTE — Telephone Encounter (Signed)
Out of mammogram hold per Dr. Sabra Heck.

## 2014-12-15 NOTE — Telephone Encounter (Signed)
-----   Message from Megan Salon, MD sent at 12/15/2014 12:06 PM EDT ----- Regarding: RE: Mammogram hold  Absolutely.  Thanks.  MSM ----- Message -----    From: Michele Mcalpine, RN    Sent: 12/15/2014  11:59 AM      To: Megan Salon, MD Subject: Mammogram hold                                 Dr. Sabra Heck,  Patient in mammogram hold. She completed breast imaging on 09/12/14. She was to remain in hold until she had a follow up breast check with Dr. Quincy Simmonds.  She has not returned phone calls x 3 and a letter was mailed to her on 11/13/14. Okay to remove from hold?

## 2014-12-26 ENCOUNTER — Encounter: Payer: Self-pay | Admitting: Internal Medicine

## 2015-02-22 DIAGNOSIS — D509 Iron deficiency anemia, unspecified: Secondary | ICD-10-CM | POA: Insufficient documentation

## 2015-02-23 ENCOUNTER — Ambulatory Visit (INDEPENDENT_AMBULATORY_CARE_PROVIDER_SITE_OTHER): Payer: BC Managed Care – PPO | Admitting: Internal Medicine

## 2015-02-23 ENCOUNTER — Encounter: Payer: Self-pay | Admitting: Internal Medicine

## 2015-02-23 VITALS — BP 130/80 | HR 76 | Ht 67.0 in | Wt 177.6 lb

## 2015-02-23 DIAGNOSIS — G8929 Other chronic pain: Secondary | ICD-10-CM

## 2015-02-23 DIAGNOSIS — R1011 Right upper quadrant pain: Secondary | ICD-10-CM

## 2015-02-23 DIAGNOSIS — R1084 Generalized abdominal pain: Secondary | ICD-10-CM

## 2015-02-23 DIAGNOSIS — R197 Diarrhea, unspecified: Secondary | ICD-10-CM | POA: Diagnosis not present

## 2015-02-23 DIAGNOSIS — D649 Anemia, unspecified: Secondary | ICD-10-CM

## 2015-02-23 DIAGNOSIS — R195 Other fecal abnormalities: Secondary | ICD-10-CM | POA: Diagnosis not present

## 2015-02-23 DIAGNOSIS — K219 Gastro-esophageal reflux disease without esophagitis: Secondary | ICD-10-CM

## 2015-02-23 DIAGNOSIS — K594 Anal spasm: Secondary | ICD-10-CM

## 2015-02-23 DIAGNOSIS — R1010 Upper abdominal pain, unspecified: Secondary | ICD-10-CM | POA: Diagnosis not present

## 2015-02-23 MED ORDER — NA SULFATE-K SULFATE-MG SULF 17.5-3.13-1.6 GM/177ML PO SOLN: 1.0000 | Freq: Once | ORAL | Status: DC

## 2015-02-23 NOTE — Progress Notes (Signed)
HISTORY OF PRESENT ILLNESS:  Lynn Spears is a 39 y.o. female who is self-referred regarding chronic GI complaints including diarrhea, abdominal pain, and Hemoccult-positive stool. The patient reports a greater than five-year history of intermittent postprandial diarrhea associated with lower abdominal cramping and urgency. She may have symptoms for one or 2 months then go weeks without issues. No nocturnal component. She does use Imodium which helps some period also, she feels that probiotics helps somewhat. She tells me that she was evaluated by a gastroenterologist in [place] 2 years ago. Records have been requested. According to the patient, testing for celiac disease was negative. She does tell me that she had Hemoccult-positive stool without gross bleeding and colonoscopy was recommended. She did not follow through due to issues with blood pressure control. Now interested in pursuing this further. She likes our office as her mother was recently the patient here. Patient has had no weight loss. Actually some weight gain. She also had problems last year with right upper quadrant pain and acid reflux. Evaluation of right upper quadrant pain included negative abdominal ultrasound per the patient. She still has intermittent right upper quadrant pain, though not severe. She cannot identify any relieving or exacerbating factors with the pain. Reflux was treated with H2 receptor antagonists with success. No longer requiring acid suppressive medication. No dysphagia. Finally, she describes rare episodes (about once per year) of intense rectal pain described as spasm. Generally last 30 minutes or less and improves with heating pad placement and NSAID. Mother has a history of colon polyps in her 27s grandmother with colon cancer in her 76s. Patient tells me that she was diagnosed with anemia this summer. Placed on iron therapy by her PCP. She does have heavy menses. No constipation  REVIEW OF SYSTEMS:  All non-GI  ROS negative except for sinus and allergy trouble, headaches, menstrual pain, swollen lymph glands  Past Medical History  Diagnosis Date  . Anxiety   . Elevated blood pressure   . Palpitations   . Allergic rhinitis   . Anemia   . Chronic headaches   . IBS (irritable bowel syndrome)     Past Surgical History  Procedure Laterality Date  . Neg hx      Social History Lynn Spears  reports that she has never smoked. She has never used smokeless tobacco. She reports that she drinks about 1.0 oz of alcohol per week. She reports that she does not use illicit drugs.  family history includes CAD in her father and mother; Colon cancer in her maternal grandmother; Colon polyps (age of onset: 67) in her mother; Diabetes in her maternal grandmother; Hypertension in her mother; Ovarian cancer in her maternal aunt; Stroke in her maternal grandmother.  Allergies  Allergen Reactions  . Biaxin [Clarithromycin] Other (See Comments)    GI upset  . Erythromycin     GI upset  . Lexapro [Escitalopram Oxalate]     Flu like Sx  . Penicillins Rash       PHYSICAL EXAMINATION: Vital signs: BP 130/80 mmHg  Pulse 76  Ht 5\' 7"  (1.702 m)  Wt 177 lb 9.6 oz (80.559 kg)  BMI 27.81 kg/m2  Constitutional: generally well-appearing, no acute distress Psychiatric: alert and oriented x3, cooperative Eyes: extraocular movements intact, anicteric, conjunctiva pink Mouth: oral pharynx moist, no lesions Neck: supple no lymphadenopathy Cardiovascular: heart regular rate and rhythm, no murmur Lungs: clear to auscultation bilaterally Abdomen: soft, nontender, nondistended, no obvious ascites, no peritoneal signs, normal bowel sounds,  no organomegaly Rectal: Extremities: no lower extremity edema bilaterally Skin: no lesions on visible extremities Neuro: No focal deficits. No asterixis.  ASSESSMENT:  #1. Chronic intermittent problems with postprandial urgency with cramping and diarrhea. Likely IBS #2.  Reports of Hemoccult-positive stool. Not worked up #3. GERD. Required transient acid suppressive therapy. Improved and not requiring therapy at present #4. Intermittent right upper quadrant pain. Negative ultrasound. #5. Proctalgia fugax #6. Reports of anemia.   PLAN:  #1. Colonoscopy with biopsies to evaluate chronic diarrhea and Hemoccult-positive stool.The nature of the procedure, as well as the risks, benefits, and alternatives were carefully and thoroughly reviewed with the patient. Ample time for discussion and questions allowed. The patient understood, was satisfied, and agreed to proceed. #2. Reflux precautions with attention to weight loss #3. On demand acid suppressive therapy as needed to control GERD symptoms #4. Discussion on proctalgia fugax. Direct pressure or warm compress appropriate to relieve symptoms #5. Obtain outside records for review #6. Additional workup and/or Treatment plans to be determined pending results of colonoscopy and review of outside records

## 2015-02-23 NOTE — Patient Instructions (Signed)

## 2015-03-26 ENCOUNTER — Telehealth: Payer: Self-pay | Admitting: Internal Medicine

## 2015-03-26 NOTE — Telephone Encounter (Signed)
Rec'd from [place] forward 8 pages to Dr.Perry

## 2015-04-20 ENCOUNTER — Encounter: Payer: Self-pay | Admitting: Internal Medicine

## 2015-04-27 ENCOUNTER — Telehealth: Payer: Self-pay | Admitting: Obstetrics and Gynecology

## 2015-04-27 NOTE — Telephone Encounter (Signed)
Patient having abnormal bleeding for over a week now. No chart

## 2015-04-27 NOTE — Telephone Encounter (Signed)
Spoke with patient. Patient states that last Monday 04/20/2015 she noticed "bright red" bleeding when she wiped after using the restroom. Patient states "I am not due to start my cycle yet." Now bleeding is intermittent. Is wearing a panty liner that she changes once per day. Has been experiencing left lower back "soreness" since 11/28 with increased urination. Lower back "soreness" has decreased today. Denies any burning with urination, fevers, or chills. Advised patient she will need to be seen in the office for further evaluation. Patient is agreeable. Offered appointment with NP but patient declines. Offered appointment for tomorrow with another MD but patient declines. Prefers to only see Dr.Silva. Appointment scheduled for 04/29/2015 at 1 pm. Advised patient if symptoms worsen or develops fevers or chills will need to be seen at local urgent care or ER over night. If this occurs tomorrow will need to be seen with another provider. Patient is agreeable.

## 2015-04-29 ENCOUNTER — Ambulatory Visit (INDEPENDENT_AMBULATORY_CARE_PROVIDER_SITE_OTHER): Payer: BC Managed Care – PPO | Admitting: Obstetrics and Gynecology

## 2015-04-29 ENCOUNTER — Encounter: Payer: Self-pay | Admitting: Obstetrics and Gynecology

## 2015-04-29 ENCOUNTER — Encounter: Payer: BC Managed Care – PPO | Admitting: Internal Medicine

## 2015-04-29 VITALS — BP 130/82 | HR 76 | Ht 67.0 in | Wt 176.0 lb

## 2015-04-29 DIAGNOSIS — R319 Hematuria, unspecified: Secondary | ICD-10-CM | POA: Diagnosis not present

## 2015-04-29 DIAGNOSIS — Z113 Encounter for screening for infections with a predominantly sexual mode of transmission: Secondary | ICD-10-CM

## 2015-04-29 DIAGNOSIS — R350 Frequency of micturition: Secondary | ICD-10-CM | POA: Diagnosis not present

## 2015-04-29 DIAGNOSIS — M25559 Pain in unspecified hip: Secondary | ICD-10-CM

## 2015-04-29 DIAGNOSIS — N939 Abnormal uterine and vaginal bleeding, unspecified: Secondary | ICD-10-CM

## 2015-04-29 LAB — POCT URINALYSIS DIPSTICK
BILIRUBIN UA: NEGATIVE
GLUCOSE UA: NEGATIVE
Ketones, UA: NEGATIVE
Leukocytes, UA: NEGATIVE
NITRITE UA: NEGATIVE
Protein, UA: NEGATIVE
UROBILINOGEN UA: NEGATIVE
pH, UA: 5

## 2015-04-29 LAB — POCT URINE PREGNANCY: PREG TEST UR: NEGATIVE

## 2015-04-29 MED ORDER — MEDROXYPROGESTERONE ACETATE 10 MG PO TABS: 10.0000 mg | ORAL_TABLET | Freq: Every day | ORAL | Status: DC

## 2015-04-29 NOTE — Progress Notes (Signed)
Patient ID: Lynn Spears, female   DOB: [birth year], 39 y.o.   MRN: [id number] GYNECOLOGY  VISIT   HPI: 39 y.o.   Married  Caucasian  female   G1P1001 with Patient's last menstrual period was 04/06/2015.   here for abnormal uterine bleeding, low pelvic pain and urinary frequency.  Patient's last normal menstrual cycle was 04-06-15.  She then began bleeding 04-10-15 for one day but has had some lower pelvic discomfort and brown spotting daily.  One week ago she states she did have a sharp pain in left flank area.  Hurt to the touch last week.  This pain is now gone.   Bilateral lower abdominal tenderness.  Nothing makes the pain better or worse. Feels like it is increasing.  Low grade temp to 99.6 at home.   Menses 04/06/15. Bleeding again on 04/20/15 with red bleeding which continues now as brown blood.  Prior to November cycles were normal.   Has some nausea, diarrhea, and hx of blood in stool. Will have a colonoscopy in January with GI. Working dx is IBS.  Wants STD testing.  Marital strife which has now improved.  Off Camilla to avoid the hormonal exposure.  Condoms for [facility] but not active for a long time until recently in November.  Urine dip today - trace blood, otherwise negative.  Having bleeding today.  UPT - negative.   GYNECOLOGIC HISTORY: Patient's last menstrual period was 04/06/2015. Contraception:condoms/withdrawal Menopausal hormone therapy: n/a Last mammogram: 09-10-14 Bil.Diag 3D w/US--See Epic Last pap smear: 09-10-14 Neg:Neg HR HPV        OB History    Gravida Para Term Preterm AB TAB SAB Ectopic Multiple Living   1 1 1  0 0 0 0 0 0 1         Patient Active Problem List   Diagnosis Date Noted  . Palpitations 11/05/2013  . Elevated blood pressure 11/05/2013  . Family history of premature CAD 11/05/2013    Past Medical History  Diagnosis Date  . Anxiety   . Elevated blood pressure   . Palpitations   . Allergic rhinitis   . Anemia   . Chronic  headaches   . IBS (irritable bowel syndrome)     Past Surgical History  Procedure Laterality Date  . Neg hx      Current Outpatient Prescriptions  Medication Sig Dispense Refill  . cetirizine (ZYRTEC) 10 MG tablet Take 10 mg by mouth daily.    . metoprolol succinate (TOPROL-XL) 25 MG 24 hr tablet Take 12.5 mg by mouth daily.      No current facility-administered medications for this visit.     ALLERGIES: Biaxin; Erythromycin; Lexapro; and Penicillins  Family History  Problem Relation Age of Onset  . Hypertension Mother   . Ovarian cancer Maternal Aunt   . Diabetes Maternal Grandmother   . Stroke Maternal Grandmother   . CAD Father     CABG in his late 24s  . CAD Mother     CABG at age 36  . Colon cancer Maternal Grandmother   . Colon polyps Mother 53    pre-cancerous    Social History   Social History  . Marital Status: Married    Spouse Name: N/A  . Number of Children: 1  . Years of Education: N/A   Occupational History  . Network engineer    Social History Main Topics  . Smoking status: Never Smoker   . Smokeless tobacco: Never Used  . Alcohol Use: 1.2 oz/week  2 Standard drinks or equivalent per week     Comment: 2 glasses of wine per week  . Drug Use: No  . Sexual Activity:    Partners: Male    Birth Control/ Protection: Abstinence, Condom     Comment: condoms/withdrawsl   Other Topics Concern  . Not on file   Social History Narrative    ROS:  Pertinent items are noted in HPI.  PHYSICAL EXAMINATION:    BP 130/82 mmHg  Pulse 76  Ht 5\' 7"  (1.702 m)  Wt 176 lb (79.833 kg)  BMI 27.56 kg/m2  LMP 04/06/2015   T 98.2 now.  General appearance: alert, cooperative and appears stated age Head: Normocephalic, without obvious abnormality, atraumatic Abdomen: soft, mild lower abdominal tenderness without guarding or rebound, no masses,  no organomegaly   Pelvic: External genitalia:  no lesions              Urethra:  normal appearing urethra with no  masses, tenderness or lesions              Bartholins and Skenes: normal                 Vagina: normal appearing vagina with normal color and discharge, no lesions.  Dark brown blood.                Cervix: no lesions and friable.  No CMT.    Bimanual Exam:  Uterus:  normal size, contour, position, consistency, mobility, non-tender              Adnexa: normal adnexa and no mass, fullness, tenderness            Chaperone was present for exam.  ASSESSMENT  Pelvic pain.  Abnormal menses.  Microscopic hematuria. Desire for STD testing.  Marital discord. Hx elevated blood pressure.  Possible IBS.  Doing GI work up.   PLAN  Counseled regarding pelvic pain and abnormal bleeding.  STD testing.  Urine micro and culture. Course of Provera 10 mg for 10 days.  Ibuprofen 600 mg po q 6 hours prn pain for 24 - 48 hours. Take with food. Return for pelvic U/S.   An After Visit Summary was printed and given to the patient.  _25_____ minutes face to face time of which over 50% was spent in counseling.

## 2015-04-30 LAB — STD PANEL
HEP B S AG: NEGATIVE
HIV 1&2 Ab, 4th Generation: NONREACTIVE

## 2015-04-30 LAB — URINALYSIS, MICROSCOPIC ONLY
BACTERIA UA: NONE SEEN [HPF]
Casts: NONE SEEN [LPF]
Crystals: NONE SEEN [HPF]
Squamous Epithelial / LPF: NONE SEEN [HPF] (ref <=5)
WBC UA: NONE SEEN WBC/HPF (ref <=5)
Yeast: NONE SEEN [HPF]

## 2015-04-30 LAB — URINE CULTURE
Colony Count: NO GROWTH
ORGANISM ID, BACTERIA: NO GROWTH

## 2015-04-30 LAB — HSV(HERPES SIMPLEX VRS) I + II AB-IGG: HSV 1 GLYCOPROTEIN G AB, IGG: 8.89 IV — AB

## 2015-04-30 LAB — HEPATITIS C ANTIBODY: HCV AB: NEGATIVE

## 2015-05-01 ENCOUNTER — Telehealth: Payer: Self-pay | Admitting: Emergency Medicine

## 2015-05-01 LAB — IPS N GONORRHOEA AND CHLAMYDIA BY PCR

## 2015-05-01 NOTE — Telephone Encounter (Signed)
-----   Message from Nunzio Cobbs, MD sent at 04/30/2015  2:21 PM EST ----- Please inform patient that her blood work is back.  There is no HIV, syphilis, hepatitis B or C.  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  There is no sign of history of genital herpes, HSV II. This HSV I exposure may have occurred a long time ago.  When we tested her for herpes in the past, this was a vulvar culture which was negative.  The negative culture just means that the lesion then either was not herpes or it could have been a false negative.    Her urine culture and the testing for gonorrhea and chlamydia are pending.  Please offer an appointment if she wishes to come in for discussion.  Cc- Marisa Sprinkles

## 2015-05-01 NOTE — Telephone Encounter (Signed)
Spoke with patient and messages from Dr. Quincy Simmonds given.  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 Declines consult.  Advised of all results, final GC/CT pending still.  Patient awaiting scheduling Pelvic ultrasound with Dr. Quincy Simmonds, she is going to wait until she knows insurance benefit to schedule.   Routing to provider for final review. Patient agreeable to disposition. Will close encounter.

## 2015-05-01 NOTE — Telephone Encounter (Signed)
-----   Message from Nunzio Cobbs, MD sent at 05/01/2015  4:28 AM EST ----- Please inform patient that the urine culture is negative.  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 All other serum STD testing is negative.  GC/CT are pending.   Cc - Marisa Sprinkles

## 2015-05-04 ENCOUNTER — Telehealth: Payer: Self-pay

## 2015-05-04 NOTE — Telephone Encounter (Signed)
-----   Message from Nunzio Cobbs, MD sent at 05/04/2015 11:16 AM EST ----- Please inform patient that testing is negative for gonorrhea and chlamydia.   Cc- Marisa Sprinkles

## 2015-05-04 NOTE — Telephone Encounter (Signed)
Spoke with patient. Results given. Patient verbalizes understanding.  Routing to provider for final review. Patient agreeable to disposition. Will close encounter.  

## 2015-05-07 ENCOUNTER — Telehealth: Payer: Self-pay | Admitting: Obstetrics and Gynecology

## 2015-05-07 NOTE — Telephone Encounter (Signed)
Called patient to review benefits for procedure. Left voicemail to call back and review. °

## 2015-06-04 ENCOUNTER — Encounter: Payer: Self-pay | Admitting: Internal Medicine

## 2015-06-04 ENCOUNTER — Ambulatory Visit (AMBULATORY_SURGERY_CENTER): Payer: BC Managed Care – PPO | Admitting: Internal Medicine

## 2015-06-04 VITALS — BP 124/84 | HR 76 | Temp 99.1°F | Resp 15 | Ht 67.0 in | Wt 177.0 lb

## 2015-06-04 DIAGNOSIS — D122 Benign neoplasm of ascending colon: Secondary | ICD-10-CM | POA: Diagnosis not present

## 2015-06-04 DIAGNOSIS — R195 Other fecal abnormalities: Secondary | ICD-10-CM

## 2015-06-04 DIAGNOSIS — R197 Diarrhea, unspecified: Secondary | ICD-10-CM

## 2015-06-04 DIAGNOSIS — D649 Anemia, unspecified: Secondary | ICD-10-CM

## 2015-06-04 HISTORY — PX: COLONOSCOPY: SHX174

## 2015-06-04 MED ORDER — SODIUM CHLORIDE 0.9 % IV SOLN: 500.0000 mL | INTRAVENOUS | Status: DC

## 2015-06-04 NOTE — Progress Notes (Signed)
Patient awakening, vss report to rn 

## 2015-06-04 NOTE — Progress Notes (Signed)
Called to room to assist during endoscopic procedure.  Patient ID and intended procedure confirmed with present staff. Received instructions for my participation in the procedure from the performing physician.  

## 2015-06-04 NOTE — Patient Instructions (Signed)
YOU HAD AN ENDOSCOPIC PROCEDURE TODAY AT THE [facility]:   Refer to the procedure report that was given to you for any specific questions about what was found during the examination.  If the procedure report does not answer your questions, please call your gastroenterologist to clarify.  If you requested that your care partner not be given the details of your procedure findings, then the procedure report has been included in a sealed envelope for you to review at your convenience later.  YOU SHOULD EXPECT: Some feelings of bloating in the abdomen. Passage of more gas than usual.  Walking can help get rid of the air that was put into your GI tract during the procedure and reduce the bloating. If you had a lower endoscopy (such as a colonoscopy or flexible sigmoidoscopy) you may notice spotting of blood in your stool or on the toilet paper. If you underwent a bowel prep for your procedure, you may not have a normal bowel movement for a few days.  Please Note:  You might notice some irritation and congestion in your nose or some drainage.  This is from the oxygen used during your procedure.  There is no need for concern and it should clear up in a day or so.  SYMPTOMS TO REPORT IMMEDIATELY:   Following lower endoscopy (colonoscopy or flexible sigmoidoscopy):  Excessive amounts of blood in the stool  Significant tenderness or worsening of abdominal pains  Swelling of the abdomen that is new, acute  Fever of 100F or higher   For urgent or emergent issues, a gastroenterologist can be reached at any hour by calling [phone].   DIET: Your first meal following the procedure should be a small meal and then it is ok to progress to your normal diet. Heavy or fried foods are harder to digest and may make you feel nauseous or bloated.  Likewise, meals heavy in dairy and vegetables can increase bloating.  Drink plenty of fluids but you should avoid alcoholic beverages for 24  hours.  ACTIVITY:  You should plan to take it easy for the rest of today and you should NOT DRIVE or use heavy machinery until tomorrow (because of the sedation medicines used during the test).    FOLLOW UP: Our staff will call the number listed on your records the next business day following your procedure to check on you and address any questions or concerns that you may have regarding the information given to you following your procedure. If we do not reach you, we will leave a message.  However, if you are feeling well and you are not experiencing any problems, there is no need to return our call.  We will assume that you have returned to your regular daily activities without incident.  If any biopsies were taken you will be contacted by phone or by letter within the next 1-3 weeks.  Please call us at [phone] if you have not heard about the biopsies in 3 weeks.    SIGNATURES/CONFIDENTIALITY: You and/or your care partner have signed paperwork which will be entered into your electronic medical record.  These signatures attest to the fact that that the information above on your After Visit Summary has been reviewed and is understood.  Full responsibility of the confidentiality of this discharge information lies with you and/or your care-partner.   Resume medications. Information given on polyps and diverticulosis. 

## 2015-06-04 NOTE — Op Note (Signed)
[place]  [address] [place], [address]   COLONOSCOPY PROCEDURE REPORT  PATIENT: Lynn Spears, Lynn Spears  MR#: [id number] BIRTHDATE: 13-Jul-1975 , 39  yrs. old GENDER: female ENDOSCOPIST: Eustace Quail, MD REFERRED BY:.  Self / Office PROCEDURE DATE:  06/04/2015 PROCEDURE:   Colonoscopy, diagnostic, Colonoscopy with biopsies, and Colonoscopy with snare polypectomy X1  ASA CLASS:   Class II INDICATIONS:unexplained diarrhea, heme positive stool elsewhere. MEDICATIONS: Monitored anesthesia care and Propofol 350 mg IV  DESCRIPTION OF PROCEDURE:   After the risks benefits and alternatives of the procedure were thoroughly explained, informed consent was obtained.  The digital rectal exam revealed no abnormalities of the rectum.   The [id number]  endoscope was introduced through the anus and advanced to the cecum, which was identified by both the appendix and ileocecal valve. No adverse events experienced.   The quality of the prep was excellent. (Suprep was used)  The instrument was then slowly withdrawn as the colon was fully examined. Estimated blood loss is zero unless otherwise noted in this procedure report.   COLON FINDINGS: The examined terminal ileum appeared to be normal. A single polyp measuring 6 mm in size was found in the ascending colon.  A polypectomy was performed with a cold snare.  The resection was complete, the polyp tissue was completely retrieved and sent to histology.   There was scattered diverticulosis.   The examination was otherwise normal.  Retroflexed views revealed no abnormalities. Random colon biopsies obtained. The time to cecum = 0.8 Withdrawal time = 9.6   The scope was withdrawn and the procedure completed. COMPLICATIONS: There were no immediate complications.  ENDOSCOPIC IMPRESSION: 1.   The examined terminal ileum appeared to be normal 2.   Single polyp was found in the ascending colon; polypectomy was performed with  a cold snare 3.   Mild scattered Diverticulosis 4.   The examination was otherwise normal . Status post random colon biopsies  RECOMMENDATIONS: 1.  Await biopsy results. Dr. Henrene Pastor will send you a letter with the results 2.  Repeat colonoscopy in 5 years if polyp adenomatous; otherwise 10 years  eSigned:  Eustace Quail, MD 06/04/2015 4:44 PM   cc: The Patient

## 2015-06-05 ENCOUNTER — Telehealth: Payer: Self-pay

## 2015-06-05 NOTE — Telephone Encounter (Signed)
  Follow up Call-  Call back number 06/04/2015  Post procedure Call Back phone  # [phone] cell  Permission to leave phone message Yes    Patient was called for follow up after procedure on 06/04/2015. No answer at the number given for follow up. A message was left on her answering machine.

## 2015-06-08 ENCOUNTER — Telehealth: Payer: Self-pay | Admitting: Internal Medicine

## 2015-06-08 NOTE — Telephone Encounter (Signed)
Pt states every time she eats she has to run to the bathroom and she is having diarrhea. Discussed with pt that she can try taking some Imodium to see if that helps. Pt verbalized understanding.

## 2015-06-09 ENCOUNTER — Encounter: Payer: Self-pay | Admitting: Internal Medicine

## 2015-06-12 ENCOUNTER — Telehealth: Payer: Self-pay | Admitting: Internal Medicine

## 2015-06-12 NOTE — Telephone Encounter (Signed)
Patient notified that no results yet.

## 2015-06-16 ENCOUNTER — Encounter: Payer: BC Managed Care – PPO | Admitting: Internal Medicine

## 2015-07-04 ENCOUNTER — Encounter (HOSPITAL_COMMUNITY): Payer: Self-pay

## 2015-07-04 DIAGNOSIS — Y92410 Unspecified street and highway as the place of occurrence of the external cause: Secondary | ICD-10-CM | POA: Diagnosis not present

## 2015-07-04 DIAGNOSIS — S4992XA Unspecified injury of left shoulder and upper arm, initial encounter: Secondary | ICD-10-CM | POA: Diagnosis not present

## 2015-07-04 DIAGNOSIS — Z8639 Personal history of other endocrine, nutritional and metabolic disease: Secondary | ICD-10-CM | POA: Insufficient documentation

## 2015-07-04 DIAGNOSIS — S50811A Abrasion of right forearm, initial encounter: Secondary | ICD-10-CM | POA: Diagnosis not present

## 2015-07-04 DIAGNOSIS — S50812A Abrasion of left forearm, initial encounter: Secondary | ICD-10-CM | POA: Insufficient documentation

## 2015-07-04 DIAGNOSIS — Y998 Other external cause status: Secondary | ICD-10-CM | POA: Diagnosis not present

## 2015-07-04 DIAGNOSIS — Y9389 Activity, other specified: Secondary | ICD-10-CM | POA: Insufficient documentation

## 2015-07-04 DIAGNOSIS — S0003XA Contusion of scalp, initial encounter: Secondary | ICD-10-CM | POA: Insufficient documentation

## 2015-07-04 DIAGNOSIS — S199XXA Unspecified injury of neck, initial encounter: Secondary | ICD-10-CM | POA: Diagnosis not present

## 2015-07-04 DIAGNOSIS — S4991XA Unspecified injury of right shoulder and upper arm, initial encounter: Secondary | ICD-10-CM | POA: Insufficient documentation

## 2015-07-04 DIAGNOSIS — S0990XA Unspecified injury of head, initial encounter: Secondary | ICD-10-CM | POA: Diagnosis present
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
## 2015-07-05 ENCOUNTER — Emergency Department (HOSPITAL_COMMUNITY)
Admission: EM | Admit: 2015-07-05 | Discharge: 2015-07-05 | Disposition: A | Discharge status: Home / Self Care | Payer: Managed Care, Other (non HMO) | Attending: Emergency Medicine | Admitting: Emergency Medicine

## 2015-07-05 DIAGNOSIS — T07XXXA Unspecified multiple injuries, initial encounter: Secondary | ICD-10-CM

## 2015-07-05 DIAGNOSIS — S0990XA Unspecified injury of head, initial encounter: Secondary | ICD-10-CM

## 2015-07-05 DIAGNOSIS — S0003XA Contusion of scalp, initial encounter: Secondary | ICD-10-CM | POA: Diagnosis not present

## 2015-07-05 HISTORY — DX: Pure hypercholesterolemia, unspecified: E78.00

## 2015-07-05 HISTORY — DX: Disorder of thyroid, unspecified: E07.9

## 2015-07-05 MED ORDER — IBUPROFEN 800 MG PO TABS: 800.0000 mg | ORAL_TABLET | Freq: Three times a day (TID) | ORAL | Status: DC | PRN

## 2015-07-05 MED ORDER — HYDROCODONE-ACETAMINOPHEN 5-325 MG PO TABS
2.0000 | ORAL_TABLET | Freq: Once | ORAL | Status: AC
  Administered 2015-07-05: 2 via ORAL
  Filled 2015-07-05: qty 2

## 2015-07-05 MED ORDER — HYDROCODONE-ACETAMINOPHEN 5-325 MG PO TABS: 1.0000 | ORAL_TABLET | Freq: Four times a day (QID) | ORAL | Status: DC | PRN

## 2015-07-05 MED ORDER — IBUPROFEN 800 MG PO TABS
800.0000 mg | ORAL_TABLET | Freq: Once | ORAL | Status: AC
  Administered 2015-07-05: 800 mg via ORAL
  Filled 2015-07-05: qty 1
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
## 2015-07-05 NOTE — ED Notes (Signed)
Rounded on patient, she is asleep.

## 2015-07-05 NOTE — ED Notes (Signed)
Pt left at this time with all belongings.  
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
## 2015-07-08 ENCOUNTER — Encounter (HOSPITAL_COMMUNITY): Payer: Self-pay | Admitting: Emergency Medicine

## 2015-07-08 ENCOUNTER — Emergency Department (INDEPENDENT_AMBULATORY_CARE_PROVIDER_SITE_OTHER)
Admission: EM | Admit: 2015-07-08 | Discharge: 2015-07-08 | Disposition: A | Discharge status: Home / Self Care | Payer: Self-pay | Source: Home / Self Care | Attending: Family Medicine | Admitting: Family Medicine

## 2015-07-08 ENCOUNTER — Emergency Department (INDEPENDENT_AMBULATORY_CARE_PROVIDER_SITE_OTHER): Payer: Managed Care, Other (non HMO)

## 2015-07-08 DIAGNOSIS — R0602 Shortness of breath: Secondary | ICD-10-CM | POA: Diagnosis not present
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
## 2015-07-14 ENCOUNTER — Ambulatory Visit: Payer: BC Managed Care – PPO | Admitting: Internal Medicine

## 2015-07-15 ENCOUNTER — Telehealth: Payer: Self-pay | Admitting: Obstetrics and Gynecology

## 2015-07-15 NOTE — Telephone Encounter (Signed)
Spoke with patient. Advised of message as seen below from [place]. She is agreeable. Order for PUS has been cancelled. She will return call if her symptoms return to schedule PUS.  Routing to provider for final review. Patient agreeable to disposition. Will close encounter.

## 2015-07-15 NOTE — Telephone Encounter (Signed)
I spoke with patient regarding recommended ultrasound. Patient has advised her symptoms have resolved and wants to know if this is still necessary.  Routed to clinical triage for review

## 2015-07-15 NOTE — Telephone Encounter (Signed)
If patient's symptoms have resolved, she does not need the pelvic ultrasound.  Ok to cancel order. Thank you for checking with me!

## 2015-07-15 NOTE — Telephone Encounter (Signed)
Spoke with patient. Patient states that since she was seen in the office with Dr.Silva on 04/28/2016 she has not experienced any further pelvic pain or bleeding. She did have a work up with GI and had a colonoscopy in January which she reports was normal other than finding of a polyp and diverticulosis. Patient would like to know if Dr.Silva recommends she proceed with PUS even though her symptoms have resolved. Advised I will speak with Dr.Silva and return call with further recommendations. She is agreeable.

## 2015-07-16 ENCOUNTER — Ambulatory Visit: Payer: BC Managed Care – PPO | Admitting: Internal Medicine

## 2015-08-19 ENCOUNTER — Ambulatory Visit: Payer: BC Managed Care – PPO | Admitting: Internal Medicine

## 2015-09-23 ENCOUNTER — Ambulatory Visit: Payer: BC Managed Care – PPO | Admitting: Obstetrics and Gynecology

## 2015-09-23 ENCOUNTER — Telehealth: Payer: Self-pay | Admitting: Obstetrics and Gynecology

## 2015-09-23 NOTE — Telephone Encounter (Signed)
Patient canceled her appointment for today.  Her child's school called and she has to pick up a sick kid. DNKA FEE?

## 2015-09-23 NOTE — Telephone Encounter (Signed)
Forwarded to administration. Cc- Thayer Ohm

## 2015-11-06 ENCOUNTER — Ambulatory Visit: Payer: BC Managed Care – PPO | Admitting: Obstetrics and Gynecology

## 2015-11-06 ENCOUNTER — Telehealth: Payer: Self-pay | Admitting: Obstetrics and Gynecology

## 2015-11-06 NOTE — Telephone Encounter (Signed)
Thank you for the update.  I have closed the counter.

## 2015-11-06 NOTE — Telephone Encounter (Signed)
Patient canceled her aex appointment today via answering machine last night. Patient said a close friend of the family has passed away unexpectedly. Patient will call later next week to reschedule.

## 2016-09-27 ENCOUNTER — Other Ambulatory Visit: Payer: Self-pay | Admitting: Obstetrics and Gynecology

## 2016-09-27 DIAGNOSIS — Z1231 Encounter for screening mammogram for malignant neoplasm of breast: Secondary | ICD-10-CM

## 2016-10-24 ENCOUNTER — Ambulatory Visit: Payer: BC Managed Care – PPO

## 2016-10-24 ENCOUNTER — Ambulatory Visit: Payer: BC Managed Care – PPO | Admitting: Obstetrics and Gynecology

## 2016-10-28 ENCOUNTER — Ambulatory Visit: Payer: BC Managed Care – PPO

## 2016-10-28 ENCOUNTER — Ambulatory Visit: Payer: BC Managed Care – PPO | Admitting: Obstetrics and Gynecology

## 2016-11-11 ENCOUNTER — Ambulatory Visit: Payer: BC Managed Care – PPO

## 2016-11-11 ENCOUNTER — Ambulatory Visit: Payer: BC Managed Care – PPO | Admitting: Obstetrics and Gynecology

## 2016-11-14 ENCOUNTER — Ambulatory Visit (INDEPENDENT_AMBULATORY_CARE_PROVIDER_SITE_OTHER): Payer: BC Managed Care – PPO | Admitting: Otolaryngology

## 2016-11-14 DIAGNOSIS — H9011 Conductive hearing loss, unilateral, right ear, with unrestricted hearing on the contralateral side: Secondary | ICD-10-CM | POA: Diagnosis not present

## 2016-11-14 DIAGNOSIS — H9311 Tinnitus, right ear: Secondary | ICD-10-CM | POA: Diagnosis not present

## 2016-11-14 DIAGNOSIS — R42 Dizziness and giddiness: Secondary | ICD-10-CM | POA: Diagnosis not present

## 2016-11-15 ENCOUNTER — Other Ambulatory Visit (INDEPENDENT_AMBULATORY_CARE_PROVIDER_SITE_OTHER): Payer: Self-pay | Admitting: Otolaryngology

## 2016-11-15 DIAGNOSIS — H918X9 Other specified hearing loss, unspecified ear: Secondary | ICD-10-CM

## 2016-11-24 ENCOUNTER — Other Ambulatory Visit: Payer: BC Managed Care – PPO

## 2016-11-24 ENCOUNTER — Ambulatory Visit
Admission: RE | Admit: 2016-11-24 | Discharge: 2016-11-24 | Disposition: A | Discharge status: Home / Self Care | Payer: BC Managed Care – PPO | Source: Ambulatory Visit | Attending: Otolaryngology | Admitting: Otolaryngology

## 2016-11-24 DIAGNOSIS — H918X9 Other specified hearing loss, unspecified ear: Secondary | ICD-10-CM

## 2016-12-02 ENCOUNTER — Ambulatory Visit
Admission: RE | Admit: 2016-12-02 | Discharge: 2016-12-02 | Disposition: A | Discharge status: Home / Self Care | Payer: BC Managed Care – PPO | Source: Ambulatory Visit | Attending: Obstetrics and Gynecology | Admitting: Obstetrics and Gynecology

## 2016-12-02 ENCOUNTER — Ambulatory Visit (INDEPENDENT_AMBULATORY_CARE_PROVIDER_SITE_OTHER): Payer: BC Managed Care – PPO | Admitting: Obstetrics and Gynecology

## 2016-12-02 ENCOUNTER — Encounter: Payer: Self-pay | Admitting: Obstetrics and Gynecology

## 2016-12-02 VITALS — BP 122/78 | HR 76 | Resp 16 | Ht 67.0 in | Wt 180.0 lb

## 2016-12-02 DIAGNOSIS — Z01419 Encounter for gynecological examination (general) (routine) without abnormal findings: Secondary | ICD-10-CM

## 2016-12-02 DIAGNOSIS — Z1231 Encounter for screening mammogram for malignant neoplasm of breast: Secondary | ICD-10-CM

## 2016-12-02 NOTE — Patient Instructions (Signed)
EXERCISE AND DIET:  We recommended that you start or continue a regular exercise program for good health. Regular exercise means any activity that makes your heart beat faster and makes you sweat.  We recommend exercising at least 30 minutes per day at least 3 days a week, preferably 4 or 5.  We also recommend a diet low in fat and sugar.  Inactivity, poor dietary choices and obesity can cause diabetes, heart attack, stroke, and kidney damage, among others.    ALCOHOL AND SMOKING:  Women should limit their alcohol intake to no more than 7 drinks/beers/glasses of wine (combined, not each!) per week. Moderation of alcohol intake to this level decreases your risk of breast cancer and liver damage. And of course, no recreational drugs are part of a healthy lifestyle.  And absolutely no smoking or even second hand smoke. Most people know smoking can cause heart and lung diseases, but did you know it also contributes to weakening of your bones? Aging of your skin?  Yellowing of your teeth and nails?  CALCIUM AND VITAMIN D:  Adequate intake of calcium and Vitamin D are recommended.  The recommendations for exact amounts of these supplements seem to change often, but generally speaking 600 mg of calcium (either carbonate or citrate) and 800 units of Vitamin D per day seems prudent. Certain women may benefit from higher intake of Vitamin D.  If you are among these women, your doctor will have told you during your visit.    PAP SMEARS:  Pap smears, to check for cervical cancer or precancers,  have traditionally been done yearly, although recent scientific advances have shown that most women can have pap smears less often.  However, every woman still should have a physical exam from her gynecologist every year. It will include a breast check, inspection of the vulva and vagina to check for abnormal growths or skin changes, a visual exam of the cervix, and then an exam to evaluate the size and shape of the uterus and  ovaries.  And after 40 years of age, a rectal exam is indicated to check for rectal cancers. We will also provide age appropriate advice regarding health maintenance, like when you should have certain vaccines, screening for sexually transmitted diseases, bone density testing, colonoscopy, mammograms, etc.   MAMMOGRAMS:  All women over 40 years old should have a yearly mammogram. Many facilities now offer a "3D" mammogram, which may cost around $50 extra out of pocket. If possible,  we recommend you accept the option to have the 3D mammogram performed.  It both reduces the number of women who will be called back for extra views which then turn out to be normal, and it is better than the routine mammogram at detecting truly abnormal areas.    COLONOSCOPY:  Colonoscopy to screen for colon cancer is recommended for all women at age 50.  We know, you hate the idea of the prep.  We agree, BUT, having colon cancer and not knowing it is worse!!  Colon cancer so often starts as a polyp that can be seen and removed at colonscopy, which can quite literally save your life!  And if your first colonoscopy is normal and you have no family history of colon cancer, most women don't have to have it again for 10 years.  Once every ten years, you can do something that may end up saving your life, right?  We will be happy to help you get it scheduled when you are ready.    Be sure to check your insurance coverage so you understand how much it will cost.  It may be covered as a preventative service at no cost, but you should check your particular policy.      Intrauterine Device Information An intrauterine device (IUD) is inserted into your uterus to prevent pregnancy. There are two types of IUDs available:  Copper IUD-This type of IUD is wrapped in copper wire and is placed inside the uterus. Copper makes the uterus and fallopian tubes produce a fluid that kills sperm. The copper IUD can stay in place for 10 years.  Hormone  IUD-This type of IUD contains the hormone progestin (synthetic progesterone). The hormone thickens the cervical mucus and prevents sperm from entering the uterus. It also thins the uterine lining to prevent implantation of a fertilized egg. The hormone can weaken or kill the sperm that get into the uterus. One type of hormone IUD can stay in place for 5 years, and another type can stay in place for 3 years.  Your health care provider will make sure you are a good candidate for a contraceptive IUD. Discuss with your health care provider the possible side effects. Advantages of an intrauterine device  IUDs are highly effective, reversible, long acting, and low maintenance.  There are no estrogen-related side effects.  An IUD can be used when breastfeeding.  IUDs are not associated with weight gain.  The copper IUD works immediately after insertion.  The hormone IUD works right away if inserted within 7 days of your period starting. You will need to use a backup method of birth control for 7 days if the hormone IUD is inserted at any other time in your cycle.  The copper IUD does not interfere with your female hormones.  The hormone IUD can make heavy menstrual periods lighter and decrease cramping.  The hormone IUD can be used for 3 or 5 years.  The copper IUD can be used for 10 years. Disadvantages of an intrauterine device  The hormone IUD can be associated with irregular bleeding patterns.  The copper IUD can make your menstrual flow heavier and more painful.  You may experience cramping and vaginal bleeding after insertion. This information is not intended to replace advice given to you by your health care provider. Make sure you discuss any questions you have with your health care provider. Document Released: 04/12/2004 Document Revised: 10/15/2015 Document Reviewed: 10/28/2012 Elsevier Interactive Patient Education  2017 Elsevier Inc.  

## 2016-12-02 NOTE — Progress Notes (Signed)
41 y.o. G10P1001 Married Caucasian female here for annual exam.    Menses regular.  Alternate between heavy and light. Normal TSH.  FSH and LH are premenopausal.   Some bloating and weight gain.   Having BP fluctuations.   Anxiety.  Teeth clenching.  Taking Flexeril. Tried Wellbutrin which increased anxiety.  Zoloft made her feel sluggish.   Has new job.  PCP:  Dr. Pleas Koch - [facility]  Patient's last menstrual period was 11/19/2016.           Sexually active: Yes.    The current method of family planning is condoms all of the time.    Exercising: Yes.    walking Smoker:  no  Health Maintenance: Pap:  09/10/14 Pap and HR HPV negative History of abnormal Pap:  no MMG:  09-10-14 Bil.Diag 3D w/US--See Epic.  Has appt today.  Colonoscopy:  06/04/15 Polyp removed -- repeat in 5-10 years BMD:   n/a TDaP:  UTD with PCP Gardasil:   N/A HIV and Hep C: 04/29/15 Negative Screening Labs: done with PCP   reports that she has never smoked. She has never used smokeless tobacco. She reports that she drinks about 9.6 oz of alcohol per week . She reports that she does not use drugs.  Past Medical History:  Diagnosis Date  . Allergic rhinitis   . Allergy   . Anemia   . Anxiety   . Chronic headaches   . Colon polyps    adenomatous  . Diverticulosis   . Elevated blood pressure   . GERD (gastroesophageal reflux disease)   . Hypertension   . IBS (irritable bowel syndrome)   . Palpitations     Past Surgical History:  Procedure Laterality Date  . neg hx    . WISDOM TOOTH EXTRACTION      Current Outpatient Prescriptions  Medication Sig Dispense Refill  . cetirizine (ZYRTEC) 10 MG tablet Take 10 mg by mouth daily.    . cyclobenzaprine (FLEXERIL) 10 MG tablet as needed.    . doxycycline (VIBRAMYCIN) 100 MG capsule as needed.    . metoprolol succinate (TOPROL-XL) 25 MG 24 hr tablet Take 12.5 mg by mouth daily.      No current facility-administered medications for this visit.      Family History  Problem Relation Age of Onset  . Hypertension Mother   . CAD Mother        CABG at age 27  . Colon polyps Mother 63       pre-cancerous  . Diabetes Maternal Grandmother   . Stroke Maternal Grandmother   . Colon cancer Maternal Grandmother        in her 82's  . CAD Father        CABG in his late 7s  . Ovarian cancer Maternal Aunt   . Throat cancer Maternal Uncle   . Pancreatic cancer Maternal Aunt   . Esophageal cancer Neg Hx   . Rectal cancer Neg Hx   . Stomach cancer Neg Hx     ROS:  Pertinent items are noted in HPI.  Otherwise, a comprehensive ROS was negative.  Exam:   BP 122/78 (BP Location: Right Arm, Patient Position: Sitting, Cuff Size: Normal)   Pulse 76   Resp 16   Ht 5\' 7"  (1.702 m)   Wt 180 lb (81.6 kg)   LMP 11/19/2016   BMI 28.19 kg/m     General appearance: alert, cooperative and appears stated age Head: Normocephalic, without obvious abnormality,  atraumatic Neck: no adenopathy, supple, symmetrical, trachea midline and thyroid normal to inspection and palpation Lungs: clear to auscultation bilaterally Breasts: normal appearance, no masses or tenderness, No nipple retraction or dimpling, No nipple discharge or bleeding, No axillary or supraclavicular adenopathy Heart: regular rate and rhythm Abdomen: soft, non-tender; no masses, no organomegaly Extremities: extremities normal, atraumatic, no cyanosis or edema Skin: Skin color, texture, turgor normal. No rashes or lesions Lymph nodes: Cervical, supraclavicular, and axillary nodes normal. No abnormal inguinal nodes palpated Neurologic: Grossly normal  Pelvic: External genitalia:  no lesions              Urethra:  normal appearing urethra with no masses, tenderness or lesions              Bartholins and Skenes: normal                 Vagina: normal appearing vagina with normal color and discharge, no lesions              Cervix: no lesions.  Friable with contact.              Pap  taken: No. Bimanual Exam:  Uterus:  normal size, contour, position, consistency, mobility, non-tender              Adnexa: no mass, fullness, tenderness              Rectal exam: Yes.  .  Confirms.              Anus:  normal sphincter tone, no lesions  Chaperone was present for exam.  Assessment:   Well woman visit with normal exam.  Plan: Mammogram appt today.  Recommended self breast awareness. Pap and HR HPV as above. Guidelines for Calcium, Vitamin D, regular exercise program including cardiovascular and weight bearing exercise. Labs done with PCP.  I briefly discussed IUDs with the patient.  She will consider.  Follow up annually and prn.    After visit summary provided.

## 2016-12-08 ENCOUNTER — Ambulatory Visit (INDEPENDENT_AMBULATORY_CARE_PROVIDER_SITE_OTHER): Payer: BC Managed Care – PPO | Admitting: Otolaryngology

## 2016-12-08 DIAGNOSIS — H8001 Otosclerosis involving oval window, nonobliterative, right ear: Secondary | ICD-10-CM

## 2016-12-08 DIAGNOSIS — H9011 Conductive hearing loss, unilateral, right ear, with unrestricted hearing on the contralateral side: Secondary | ICD-10-CM | POA: Diagnosis not present

## 2016-12-08 DIAGNOSIS — R42 Dizziness and giddiness: Secondary | ICD-10-CM | POA: Diagnosis not present

## 2017-01-16 ENCOUNTER — Ambulatory Visit (INDEPENDENT_AMBULATORY_CARE_PROVIDER_SITE_OTHER): Payer: BC Managed Care – PPO | Admitting: Otolaryngology

## 2017-06-30 ENCOUNTER — Telehealth: Payer: Self-pay | Admitting: Obstetrics and Gynecology

## 2017-06-30 NOTE — Telephone Encounter (Signed)
Patient called requesting to speak with the nurse about birth control options. She said she previously spoke with Dr. Quincy Simmonds about this too.Marland Kitchen

## 2017-06-30 NOTE — Telephone Encounter (Signed)
Spoke with patient. Patient states that she is having increased acne. Is taking Doxycycline daily and using topical cream prescribed by the Dermatologist. Reports still having problems with acne and Dermatology recommended starting birth control. States she is on medication for BP Metoprolol 25 mg daily and BP is stable. Asking what Dr.Silva would recommend. Advised will review with Dr.Silva and return call.

## 2017-07-03 NOTE — Telephone Encounter (Signed)
Oral contraceptives are not recommended.  Combined oral contraceptives are not recommended in patients with hypertension. Progesterone only contraceptives may just make her acne worse.  She may want to ask her dermatologist about using spironolactone to treat her acne.

## 2017-07-03 NOTE — Telephone Encounter (Signed)
Spoke with patient. Advised of message as seen below from [place]. Patient verbalizes understating. Encounter closed.

## 2017-11-20 ENCOUNTER — Other Ambulatory Visit: Payer: Self-pay

## 2017-11-20 ENCOUNTER — Encounter (HOSPITAL_COMMUNITY): Payer: Self-pay | Admitting: Emergency Medicine

## 2017-11-20 ENCOUNTER — Ambulatory Visit (HOSPITAL_COMMUNITY)
Admission: EM | Admit: 2017-11-20 | Discharge: 2017-11-20 | Disposition: A | Discharge status: Home / Self Care | Payer: BLUE CROSS/BLUE SHIELD | Attending: Family Medicine | Admitting: Family Medicine

## 2017-11-20 DIAGNOSIS — Z3202 Encounter for pregnancy test, result negative: Secondary | ICD-10-CM

## 2017-11-20 DIAGNOSIS — N939 Abnormal uterine and vaginal bleeding, unspecified: Secondary | ICD-10-CM | POA: Diagnosis not present

## 2017-11-20 DIAGNOSIS — Z79899 Other long term (current) drug therapy: Secondary | ICD-10-CM | POA: Insufficient documentation

## 2017-11-20 DIAGNOSIS — E079 Disorder of thyroid, unspecified: Secondary | ICD-10-CM | POA: Insufficient documentation

## 2017-11-20 DIAGNOSIS — K0889 Other specified disorders of teeth and supporting structures: Secondary | ICD-10-CM | POA: Diagnosis not present

## 2017-11-20 DIAGNOSIS — N920 Excessive and frequent menstruation with regular cycle: Secondary | ICD-10-CM | POA: Insufficient documentation

## 2017-11-20 DIAGNOSIS — K029 Dental caries, unspecified: Secondary | ICD-10-CM | POA: Diagnosis not present

## 2017-11-20 DIAGNOSIS — Z888 Allergy status to other drugs, medicaments and biological substances status: Secondary | ICD-10-CM | POA: Insufficient documentation

## 2017-11-20 DIAGNOSIS — E7800 Pure hypercholesterolemia, unspecified: Secondary | ICD-10-CM | POA: Insufficient documentation

## 2017-11-20 DIAGNOSIS — N938 Other specified abnormal uterine and vaginal bleeding: Secondary | ICD-10-CM | POA: Diagnosis not present

## 2017-11-20 LAB — POCT PREGNANCY, URINE: PREG TEST UR: NEGATIVE

## 2017-11-20 LAB — POCT I-STAT, CHEM 8
BUN: 11 mg/dL (ref 6–20)
CALCIUM ION: 1.23 mmol/L (ref 1.15–1.40)
Chloride: 105 mmol/L (ref 98–111)
Creatinine, Ser: 1.1 mg/dL — ABNORMAL HIGH (ref 0.44–1.00)
Glucose, Bld: 80 mg/dL (ref 70–99)
HEMATOCRIT: 36 % (ref 36.0–46.0)
Hemoglobin: 12.2 g/dL (ref 12.0–15.0)
Potassium: 3.9 mmol/L (ref 3.5–5.1)
SODIUM: 141 mmol/L (ref 135–145)
TCO2: 23 mmol/L (ref 22–32)

## 2017-11-20 MED ORDER — IBUPROFEN 800 MG PO TABS: 800.0000 mg | ORAL_TABLET | Freq: Three times a day (TID) | ORAL | 0 refills | Status: DC

## 2017-11-20 MED ORDER — MEGESTROL ACETATE 40 MG PO TABS: 40.0000 mg | ORAL_TABLET | Freq: Two times a day (BID) | ORAL | 0 refills | Status: AC

## 2017-11-20 NOTE — ED Triage Notes (Signed)
Pt reports an abnormal menstrual cycle with heavy bleeding.  She also reports bilateral lower dental pain.

## 2017-11-20 NOTE — Discharge Instructions (Signed)
Hgb and Hct normal tonight which is reassuring. I have sent for medication which you can start to stop the bleeding. Please follow up with your primary care provider and/or gynecology for recheck. If develop weakness, dizziness, abdominal pain or fevers please return or go to Er.  Please follow up with your dentist for dental care.

## 2017-11-20 NOTE — ED Provider Notes (Signed)
[facility]    CSN: [id number] Arrival date & time: 11/20/17  1953     History   Chief Complaint Chief Complaint  Patient presents with  . Menorrhagia  . Dental Pain    bilateral lower    HPI Monica Lawrence is a 42 y.o. female.   Monica Lawrence presents with complaints of dental pain after biting down on a hard candy two days ago and chipping her tooth. No drainage or facial swelling. Ibuprofen has helped with the pain. Has a dentist but has not seen yet.   Primarily presents with complaints of abnormal vaginal bleeding. States her period came 2 weeks early and has been very heavy with clots. Has had to buy and wear depends briefs due to heaviness. Had been changing pad every hour. Started bleeding 6/26. No abdominal pain. No other vaginal symptoms - discharge, itching, burning. No urinary symptoms. States has had complications since her first period with intermittent heavy bleeding and had been on birth control for years. No longer on. Has a PCP but does not follow with a gynecologist. No weakness or dizziness. No nausea, vomiting or diarrhea.    ROS per HPI.      Past Medical History:  Diagnosis Date  . High cholesterol   . Thyroid disease     There are no active problems to display for this patient.   History reviewed. No pertinent surgical history.  OB History   None      Home Medications    Prior to Admission medications   Medication Sig Start Date End Date Taking? Authorizing Provider  atorvastatin (LIPITOR) 10 MG tablet TK 1 T PO HS 07/22/17  Yes [provider]  fluticasone (FLONASE) 50 MCG/ACT nasal spray 1 spray.   Yes [provider]  ibuprofen (ADVIL,MOTRIN) 800 MG tablet Take 1 tablet (800 mg total) by mouth 3 (three) times daily. 11/20/17   Georgetta Haber, NP  megestrol (MEGACE) 40 MG tablet Take 1 tablet (40 mg total) by mouth 2 (two) times daily for 10 days. 11/20/17 11/30/17  Georgetta Haber, NP    Family History History  reviewed. No pertinent family history.  Social History Social History   Tobacco Use  . Smoking status: Never Smoker  . Smokeless tobacco: Never Used  Substance Use Topics  . Alcohol use: No  . Drug use: No     Allergies   Ambien [zolpidem tartrate] and Neomycin   Review of Systems Review of Systems   Physical Exam Triage Vital Signs ED Triage Vitals  Enc Vitals Group     BP 11/20/17 2040 132/89     Pulse Rate 11/20/17 2040 79     Resp --      Temp 11/20/17 2040 98.4 F (36.9 C)     Temp Source 11/20/17 2040 Temporal     SpO2 11/20/17 2040 100 %     Weight --      Height --      Head Circumference --      Peak Flow --      Pain Score 11/20/17 2038 6     Pain Loc --      Pain Edu? --      Excl. in GC? --    No data found.  Updated Vital Signs BP 132/89 (BP Location: Left Wrist)   Pulse 79   Temp 98.4 F (36.9 C) (Temporal)   LMP 11/15/2017 (Exact Date)   SpO2 100%    Physical Exam  Constitutional: She is oriented to person, place, and time. She appears well-developed and well-nourished. No distress.  HENT:  Head: Normocephalic and atraumatic.  Mouth/Throat: Dental caries present.  Large caries to tooth #17 noted; no swelling, visible abscess, drainage; minimal tenderness to jawline   Cardiovascular: Normal rate, regular rhythm and normal heart sounds.  Pulmonary/Chest: Effort normal and breath sounds normal.  Abdominal: Soft. Bowel sounds are normal. There is no tenderness.  Genitourinary: Uterus normal. Cervix exhibits no motion tenderness, no discharge and no friability. Right adnexum displays no tenderness. Left adnexum displays no tenderness. There is bleeding in the vagina.  Genitourinary Comments: Heavy bleeding and clots; non tender, no other acute findings.   Neurological: She is alert and oriented to person, place, and time.  Skin: Skin is warm and dry.     UC Treatments / Results  Labs (all labs ordered are listed, but only abnormal  results are displayed) Labs Reviewed  POCT I-STAT, CHEM 8 - Abnormal; Notable for the following components:      Result Value   Creatinine, Ser 1.10 (*)    All other components within normal limits  POCT PREGNANCY, URINE  URINE CYTOLOGY ANCILLARY ONLY    EKG None  Radiology No results found.  Procedures Procedures (including critical care time)  Medications Ordered in UC Medications - No data to display  Initial Impression / Assessment and Plan / UC Course  I have reviewed the triage vital signs and the nursing notes.  Pertinent labs & imaging results that were available during my care of the patient were reviewed by me and considered in my medical decision making (see chart for details).    Non toxic. Without tachycardia. bp stable. No dizziness. Chem 8 reassuring. Megace initiated at this time. Encouraged follow up with PCP and/or gyn for recheck. ibuprofen for pain, follow up with dentist. Patient verbalized understanding and agreeable to plan.    Final Clinical Impressions(s) / UC Diagnoses   Final diagnoses:  Pain, dental  Abnormal uterine bleeding     Discharge Instructions     Hgb and Hct normal tonight which is reassuring. I have sent for medication which you can start to stop the bleeding. Please follow up with your primary care provider and/or gynecology for recheck. If develop weakness, dizziness, abdominal pain or fevers please return or go to Er.  Please follow up with your dentist for dental care.     ED Prescriptions    Medication Sig Dispense Auth. Provider   megestrol (MEGACE) 40 MG tablet Take 1 tablet (40 mg total) by mouth 2 (two) times daily for 10 days. 20 tablet Linus MakoBurky, Natalie B, NP   ibuprofen (ADVIL,MOTRIN) 800 MG tablet Take 1 tablet (800 mg total) by mouth 3 (three) times daily. 21 tablet Georgetta HaberBurky, Natalie B, NP     Controlled Substance Prescriptions [place] Controlled Substance Registry consulted? Not Applicable   Georgetta HaberBurky, Natalie B,  NP 11/20/17 2124

## 2017-11-21 LAB — URINE CYTOLOGY ANCILLARY ONLY
CHLAMYDIA, DNA PROBE: NEGATIVE
NEISSERIA GONORRHEA: NEGATIVE
TRICH (WINDOWPATH): NEGATIVE

## 2017-11-22 LAB — URINE CYTOLOGY ANCILLARY ONLY
BACTERIAL VAGINITIS: POSITIVE — AB
CANDIDA VAGINITIS: NEGATIVE

## 2017-11-24 ENCOUNTER — Telehealth (HOSPITAL_COMMUNITY): Payer: Self-pay

## 2017-11-24 NOTE — Telephone Encounter (Signed)
Bacterial vaginosis is positive. This was not treated at the urgent care visit.  Attempted to reach patient regarding positive results. No answer at this time.

## 2017-12-06 ENCOUNTER — Ambulatory Visit: Payer: BC Managed Care – PPO | Admitting: Obstetrics and Gynecology

## 2018-01-08 ENCOUNTER — Ambulatory Visit: Payer: BC Managed Care – PPO | Admitting: Obstetrics and Gynecology

## 2018-04-05 ENCOUNTER — Telehealth: Payer: Self-pay | Admitting: Neurology

## 2018-04-05 NOTE — Telephone Encounter (Signed)
The patient received a flu shot 2 weeks ago, within the last 9 to 12 days she has developed generalized weakness and fatigue of the muscles, some discomfort as well.  The patient was seen by Dr. Pleas Koch yesterday, her deep tendon reflexes are normal.  She has had blood work that includes a normal CK enzyme level and thyroid profile.  The patient is not reporting any difficulty with breathing.  She feels that she is having some generalized spasms of the muscle.  We will need to get a work in revisit as soon as possible.  Dr. Pleas Koch may be getting an MRI evaluation of the cervical spine.  A transverse myelitis needs to be excluded, possibility of myasthenia gravis or even Guillain-Barre syndrome still needs to be considered.

## 2018-04-06 NOTE — Telephone Encounter (Signed)
I called pt to get her scheduled for an appt. Dr. Krista Blue has an opening on 04/10/18 at 10:00am. No answer, left a message asking her to call us back. Please ask pt if this appt will work for her. Also check to see if there are any openings on Monday 04/09/18 on any of the MD's schedules for this pt.

## 2018-04-06 NOTE — Telephone Encounter (Signed)
Pt called back, spoke to our referrals dept, and was scheduled with Dr. Krista Blue on 04/10/18 at 10:00am.

## 2018-04-10 ENCOUNTER — Ambulatory Visit: Payer: BC Managed Care – PPO | Admitting: Neurology

## 2018-04-10 ENCOUNTER — Encounter: Payer: Self-pay | Admitting: Neurology

## 2018-04-10 VITALS — BP 138/95 | HR 95 | Ht 67.0 in | Wt 185.0 lb

## 2018-04-10 DIAGNOSIS — M62838 Other muscle spasm: Secondary | ICD-10-CM | POA: Diagnosis not present

## 2018-04-10 MED ORDER — DULOXETINE HCL 60 MG PO CPEP
60.0000 mg | ORAL_CAPSULE | Freq: Every day | ORAL | 12 refills | Status: DC
Start: 2018-04-10 — End: 2018-05-02

## 2018-04-10 NOTE — Progress Notes (Signed)
PATIENT: Lynn Spears DOB: March 29, 1976  Chief Complaint  Patient presents with  . Muscle Weakness    She is here with her husband, Bruce. She received a flu shot on 03/13/18, within two weeks she developed generalized weakness, spasms, pain and fatigue of the muscles.    Marland Kitchen PCP    Burdine, Virgina Evener, MD     HISTORICAL  Lynn Spears is a 42 year old female, seen in request by her primary care physician Dr. Judd Lien for evaluation of muscle weakness, initial evaluation was on April 10, 2018. She is accompanied by her husband Bruce at today's visit.  I have reviewed and summarized the referring note from the referring physician.  She had past medical history of depression anxiety, was treated with Wellbutrin, which has helped her symptoms.  On March 26, 2018, she woke up 5 AM, noticed severe muscle spasm from left neck to left arm, left side of her body, and the left leg.  It was so painful, she has difficulty moving, she actually presented to [facility] emergency room, patient reported extensive nonrevealing laboratory evaluations.  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  She denies gait abnormality, denies dysarthria, no persistent sensory loss,   I reviewed MRI report from [facility] on April 09, 2018, MRI of cervical spine and brain showed no significant abnormality.    REVIEW OF SYSTEMS: Full 14 system review of systems performed and notable only for depression, anxiety, decreased energy, headaches, numbness, weakness, cramps, aching muscles, allergy, skin sensitivity, anemia, snoring, diarrhea, hearing loss, swelling in legs, fatigue, fever. All other review of systems were negative.  ALLERGIES: Allergies  Allergen Reactions  . Biaxin [Clarithromycin]  Other (See Comments)    GI upset  . Erythromycin     GI upset  . Lexapro [Escitalopram Oxalate]     Flu like Sx  . Penicillins Rash    HOME MEDICATIONS: Current Outpatient Medications  Medication Sig Dispense Refill  . cyclobenzaprine (FLEXERIL) 10 MG tablet as needed.    . metoprolol succinate (TOPROL-XL) 25 MG 24 hr tablet Take 12.5 mg by mouth daily.      No current facility-administered medications for this visit.     PAST MEDICAL HISTORY: Past Medical History:  Diagnosis Date  . Allergic rhinitis   . Allergy   . Anemia   . Anxiety   . Chronic headaches   . Colon polyps    adenomatous  . Diverticulosis   . Elevated blood pressure   . GERD (gastroesophageal reflux disease)   . Hypertension   . IBS (irritable bowel syndrome)   . Palpitations     PAST SURGICAL HISTORY: Past Surgical History:  Procedure Laterality Date  . WISDOM TOOTH EXTRACTION      FAMILY HISTORY: Family History  Problem Relation Age of Onset  . Hypertension Mother   . CAD Mother        CABG at age 96  . Colon polyps Mother 105       pre-cancerous  . Diabetes Maternal Grandmother   . Stroke Maternal Grandmother   . Colon cancer Maternal Grandmother        in her 74's  . CAD Father        CABG in his late 56s  . Ovarian  cancer Maternal Aunt   . Throat cancer Maternal Uncle   . Pancreatic cancer Maternal Aunt   . Esophageal cancer Neg Hx   . Rectal cancer Neg Hx   . Stomach cancer Neg Hx     SOCIAL HISTORY: Social History   Socioeconomic History  . Marital status: Married    Spouse name: Not on file  . Number of children: 1  . Years of education: some college  . Highest education level: Not on file  Occupational History  . Occupation: Network engineer  Social Needs  . Financial resource strain: Not on file  . Food insecurity:    Worry: Not on file    Inability: Not on file  . Transportation needs:    Medical: Not on file    Non-medical: Not on file  Tobacco Use  . Smoking  status: Never Smoker  . Smokeless tobacco: Never Used  Substance and Sexual Activity  . Alcohol use: Yes    Comment: socially  . Drug use: No  . Sexual activity: Yes    Partners: Male    Birth control/protection: Condom    Comment: condoms/withdrawsl  Lifestyle  . Physical activity:    Days per week: Not on file    Minutes per session: Not on file  . Stress: Not on file  Relationships  . Social connections:    Talks on phone: Not on file    Gets together: Not on file    Attends religious service: Not on file    Active member of club or organization: Not on file    Attends meetings of clubs or organizations: Not on file    Relationship status: Not on file  . Intimate partner violence:    Fear of current or ex partner: Not on file    Emotionally abused: Not on file    Physically abused: Not on file    Forced sexual activity: Not on file  Other Topics Concern  . Not on file  Social History Narrative   Lives at home with her husband.   Right-handed.   1 cup caffeine per day.     PHYSICAL EXAM   Vitals:   04/10/18 0947  BP: (!) 138/95  Pulse: 95  Weight: 185 lb (83.9 kg)  Height: 5\' 7"  (1.702 m)    Not recorded      Body mass index is 28.98 kg/m.  PHYSICAL EXAMNIATION:  Gen: NAD, conversant, well nourised, obese, well groomed                     Cardiovascular: Regular rate rhythm, no peripheral edema, warm, nontender. Eyes: Conjunctivae clear without exudates or hemorrhage Neck: Supple, no carotid bruits. Pulmonary: Clear to auscultation bilaterally   NEUROLOGICAL EXAM:  MENTAL STATUS: Speech:    Speech is normal; fluent and spontaneous with normal comprehension.  Cognition:     Orientation to time, place and person     Normal recent and remote memory     Normal Attention span and concentration     Normal Language, naming, repeating,spontaneous speech     Fund of knowledge   CRANIAL NERVES: CN II: Visual fields are full to confrontation.  Fundoscopic exam is normal with sharp discs and no vascular changes. Pupils are round equal and briskly reactive to light. CN III, IV, VI: extraocular movement are normal. No ptosis. CN V: Facial sensation is intact to pinprick in all 3 divisions bilaterally. Corneal responses are intact.  CN VII: Face is symmetric  with normal eye closure and smile. CN VIII: Hearing is normal to rubbing fingers CN IX, X: Palate elevates symmetrically. Phonation is normal. CN XI: Head turning and shoulder shrug are intact CN XII: Tongue is midline with normal movements and no atrophy.  MOTOR: There is no pronator drift of out-stretched arms. Muscle bulk and tone are normal. Muscle strength is normal.  REFLEXES: Reflexes are 2+ and symmetric at the biceps, triceps, knees, and ankles. Plantar responses are flexor.  SENSORY: Intact to light touch, pinprick, positional sensation and vibratory sensation are intact in fingers and toes.  COORDINATION: Rapid alternating movements and fine finger movements are intact. There is no dysmetria on finger-to-nose and heel-knee-shin.    GAIT/STANCE: Posture is normal. Gait is steady with normal steps, base, arm swing, and turning. Heel and toe walking are normal. Tandem gait is normal.  Romberg is absent.   DIAGNOSTIC DATA (LABS, IMAGING, TESTING) - I reviewed patient records, labs, notes, testing and imaging myself where available.   ASSESSMENT AND PLAN  NASHALIE SALLIS is a 42 y.o. female   Left side body spasm,  EMG nerve conduction study to rule out intrinsic muscle disease.  Laboratory evaluation from [facility]  Cymbalta 60 mg daily,  Flexeril, NSAIDs as needed   Marcial Pacas, M.D. Ph.D.  [facility] [address] [place], [place] [address] Ph: [phone] Fax: [phone]  CC: Curlene Labrum, MD

## 2018-04-10 NOTE — Patient Instructions (Signed)
Electromyoneurogram Electromyoneurogram is a test to check how well your muscles and nerves are working. This procedure includes the combined use of electromyogram (EMG) and nerve conduction study (NCS). EMG is used to look for muscular disorders. NCS, which is also called electroneurogram, measures how well your nerves are controlling your muscles. The procedures are usually performed together to check if your muscles and nerves are healthy. If the reaction to testing is abnormal, this can indicate disease or injury, such as peripheral nerve damage. Tell a health care provider about:  Any allergies you have.  All medicines you are taking, including vitamins, herbs, eye drops, creams, and over-the-counter medicines.  Any problems you or family members have had with anesthetic medicines.  Any blood disorders you have.  Any surgeries you have had.  Any medical conditions you have.  Any pacemaker you have. What are the risks? Generally, this is a safe procedure. However, problems may occur, including:  Infection where the electrodes were inserted.  Bleeding.  What happens before the procedure?  Ask your health care provider about: ? Changing or stopping your regular medicines. This is especially important if you are taking diabetes medicines or blood thinners. ? Taking medicines such as aspirin and ibuprofen. These medicines can thin your blood. Do not take these medicines before your procedure if your health care provider instructs you not to.  Your health care provider may ask you to avoid: ? Caffeine, such as coffee and tea. ? Nicotine. This includes cigarettes and anything with tobacco.  Do not use lotions or creams on the same day that you will be having the procedure. What happens during the procedure? For EMG:  Your health care provider will ask you to stay in a position so that he or she can access the muscle that will be studied. You may be standing, sitting down, or  lying down.  You may be given a medicine that numbs the area (local anesthetic).  A very thin needle that has an electrode on it will be inserted into your muscle.  Another small electrode will be placed on your skin near the muscle.  Your health care provider will ask you to continue to remain still.  The electrodes will send a signal that tells about the electrical activity of your muscles. You may see this on a monitor or hear it in the room.  After your muscles have been studied at rest, your health care provider will ask you to contract or flex your muscles. The electrodes will send a signal that tells about the electrical activity of your muscles.  Your health care provider will remove the electrodes and the electrode needles when the procedure is finished. The procedure may vary among health care providers and hospitals. For NCS:  An electrode that records your nerve activity (recording electrode) will be placed on your skin by the muscle that is being studied.  An electrode that is used as a reference (reference electrode) will be placed near the recording electrode.  A paste or gel will be applied to your skin between the recording electrode and the reference electrode.  Your nerve will be stimulated with a mild shock. Your health care provider will measure how much time it takes for your muscle to react.  Your health care provider will remove the electrodes and the gel when the procedure is finished. The procedure may vary among health care providers and hospitals. What happens after the procedure?  It is your responsibility to obtain your test   results. Ask your health care provider or the department performing the test when and how you will get your results.  Your health care provider may: ? Give you medicines for any pain. ? Monitor the insertion sites to make sure that they stop bleeding. This information is not intended to replace advice given to you by your health  care provider. Make sure you discuss any questions you have with your health care provider. Document Released: 09/09/2004 Document Revised: 10/15/2015 Document Reviewed: 06/30/2014 Elsevier Interactive Patient Education  2018 [employer]  

## 2018-05-01 DIAGNOSIS — D519 Vitamin B12 deficiency anemia, unspecified: Secondary | ICD-10-CM | POA: Insufficient documentation

## 2018-05-02 ENCOUNTER — Other Ambulatory Visit: Payer: Self-pay | Admitting: Neurology

## 2018-05-11 ENCOUNTER — Encounter: Payer: BC Managed Care – PPO | Admitting: Neurology

## 2018-06-15 ENCOUNTER — Encounter: Payer: BC Managed Care – PPO | Admitting: Neurology

## 2018-07-12 ENCOUNTER — Encounter: Payer: Self-pay | Admitting: Neurology

## 2018-08-03 ENCOUNTER — Encounter: Payer: BC Managed Care – PPO | Admitting: Neurology

## 2019-01-22 ENCOUNTER — Other Ambulatory Visit: Payer: Self-pay

## 2019-01-22 ENCOUNTER — Encounter: Payer: Self-pay | Admitting: Rheumatology

## 2019-01-22 ENCOUNTER — Ambulatory Visit: Payer: BC Managed Care – PPO | Admitting: Rheumatology

## 2019-01-22 VITALS — BP 128/88 | HR 82 | Resp 13 | Ht 67.0 in | Wt 192.2 lb

## 2019-01-22 DIAGNOSIS — E538 Deficiency of other specified B group vitamins: Secondary | ICD-10-CM

## 2019-01-22 DIAGNOSIS — E559 Vitamin D deficiency, unspecified: Secondary | ICD-10-CM

## 2019-01-22 DIAGNOSIS — R5383 Other fatigue: Secondary | ICD-10-CM

## 2019-01-22 DIAGNOSIS — R002 Palpitations: Secondary | ICD-10-CM

## 2019-01-22 DIAGNOSIS — Z8719 Personal history of other diseases of the digestive system: Secondary | ICD-10-CM

## 2019-01-22 DIAGNOSIS — D508 Other iron deficiency anemias: Secondary | ICD-10-CM

## 2019-01-22 DIAGNOSIS — M62838 Other muscle spasm: Secondary | ICD-10-CM

## 2019-01-22 DIAGNOSIS — Z8659 Personal history of other mental and behavioral disorders: Secondary | ICD-10-CM

## 2019-01-22 NOTE — Patient Instructions (Signed)
Magnesium malate 250 mg by mouth nightly

## 2019-01-22 NOTE — Progress Notes (Signed)
Office Visit Note  Patient: Lynn Spears             Date of Birth: 1976-03-02           MRN: [id number]             PCP: Curlene Labrum, MD Referring: Curlene Labrum, MD Visit Date: 01/22/2019 Occupation: EC data manager for [employer]  Subjective:  Frequent muscle spasms.   History of Present Illness: Lynn Spears is a 43 y.o. female seen in consultation per request of her PCP.  According to patient her symptoms started in November 2019 with muscle spasms.  She states she experiences muscle spasm her entire left side of her body which was severe.  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  The next day she was seen by her PCP and was noticed to have low-grade fever, palpitations and elevated blood pressure.  She states she was sent to the emergency room where they had labs and diagnosed her with restless leg syndrome.  She went back to her PCP and some labs were drawn.  She was also referred to a neurologist who did MRI of her brain and cervical spine which were normal.  They also found that her neurological exam was normal.  An EMG was scheduled and rescheduled to record but she could not get EMG test done.  She states her PCP also found that she had B12 deficiency and vitamin D deficiency which she has been taking now and her most recent labs were normal.  She states she is also anemic.  She has been on gabapentin and muscle relaxers and went back to work in February as her symptoms improved.  She states after the [place] pandemic she was working through the home and her symptoms were manageable.  In July she returned back to work and did well after 2 to 3weeks her symptoms recurred.  She states the muscles spasms could be every few weeks or 2 to 3  times a week.  The spasms are mostly at night.  She denies any joint pain.  Activities of Daily Living:  Patient  reports morning stiffness for 30 minutes.   Patient Reports nocturnal pain.  Difficulty dressing/grooming: Denies Difficulty climbing stairs: Denies Difficulty getting out of chair: Denies Difficulty using hands for taps, buttons, cutlery, and/or writing: Denies  Review of Systems  Constitutional: Positive for fatigue and weight gain. Negative for night sweats and weight loss.  HENT: Negative for mouth sores, trouble swallowing, trouble swallowing, mouth dryness and nose dryness.   Eyes: Negative for pain, redness, visual disturbance and dryness.  Respiratory: Negative for cough, shortness of breath and difficulty breathing.   Cardiovascular: Positive for palpitations. Negative for chest pain, hypertension, irregular heartbeat and swelling in legs/feet.       Cardiology work-up was negative few years back.  Gastrointestinal: Positive for diarrhea. Negative for blood in stool and constipation.       History of IBS  Endocrine: Negative for increased urination.  Genitourinary: Negative for vaginal dryness.  Musculoskeletal: Positive for myalgias and myalgias. Negative for arthralgias, joint pain, joint swelling, muscle weakness, morning stiffness and muscle tenderness.  Skin: Negative for color change, rash, hair loss, skin tightness, ulcers and sensitivity to sunlight.  Allergic/Immunologic: Negative for susceptible to infections.  Neurological: Negative for dizziness, memory loss, night sweats  and weakness.  Hematological: Negative for swollen glands.  Psychiatric/Behavioral: Negative for depressed mood and sleep disturbance. The patient is nervous/anxious.     PMFS History:  Patient Active Problem List   Diagnosis Date Noted  . Muscle spasm 04/10/2018  . Palpitations 11/05/2013  . Elevated blood pressure 11/05/2013  . Family history of premature CAD 11/05/2013    Past Medical History:  Diagnosis Date  . Allergic rhinitis   . Allergy   . Anemia   . Anxiety   . Chronic headaches    . Colon polyps    adenomatous  . Diverticulosis   . Elevated blood pressure   . GERD (gastroesophageal reflux disease)   . Hypertension   . IBS (irritable bowel syndrome)   . Palpitations     Family History  Problem Relation Age of Onset  . Hypertension Mother   . CAD Mother        CABG at age 67  . Colon polyps Mother 59       pre-cancerous  . Diabetes Maternal Grandmother   . Stroke Maternal Grandmother   . Colon cancer Maternal Grandmother        in her 36's  . CAD Father        CABG in his late 64s  . Ovarian cancer Maternal Aunt   . Throat cancer Maternal Uncle   . Pancreatic cancer Maternal Aunt   . Scoliosis Daughter   . Healthy Daughter   . Esophageal cancer Neg Hx   . Rectal cancer Neg Hx   . Stomach cancer Neg Hx    Past Surgical History:  Procedure Laterality Date  . WISDOM TOOTH EXTRACTION     Social History   Social History Narrative   Lives at home with her husband.   Right-handed.   1 cup caffeine per day.    There is no immunization history on file for this patient.   Objective: Vital Signs: BP 128/88 (BP Location: Right Arm, Patient Position: Sitting, Cuff Size: Normal)   Pulse 82   Resp 13   Ht 5\' 7"  (1.702 m)   Wt 192 lb 3.2 oz (87.2 kg)   BMI 30.10 kg/m    Physical Exam Vitals signs and nursing note reviewed.  Constitutional:      Appearance: She is well-developed.  HENT:     Head: Normocephalic and atraumatic.  Eyes:     Conjunctiva/sclera: Conjunctivae normal.  Neck:     Musculoskeletal: Normal range of motion.  Cardiovascular:     Rate and Rhythm: Normal rate and regular rhythm.     Heart sounds: Normal heart sounds.  Pulmonary:     Effort: Pulmonary effort is normal.     Breath sounds: Normal breath sounds.  Abdominal:     General: Bowel sounds are normal.     Palpations: Abdomen is soft.  Lymphadenopathy:     Cervical: No cervical adenopathy.  Skin:    General: Skin is warm and dry.     Capillary Refill:  Capillary refill takes less than 2 seconds.  Neurological:     Mental Status: She is alert and oriented to person, place, and time.  Psychiatric:        Behavior: Behavior normal.      Musculoskeletal Exam: C-spine thoracic and lumbar spine with good range of motion.  Shoulder joints elbow joints wrist joint MCPs PIPs DIPs with good range of motion with no synovitis.  Hip joints knee joints ankles MTPs PIPs with good range of  motion with no synovitis.  She had no muscular weakness or tenderness on examination.  She could get up from a squatting position.  CDAI Exam: CDAI Score: - Patient Global: -; Provider Global: - Swollen: -; Tender: - Joint Exam   No joint exam has been documented for this visit   There is currently no information documented on the homunculus. Go to the Rheumatology activity and complete the homunculus joint exam.  Investigation: No additional findings.  Imaging: No results found.  Recent Labs: Lab Results  Component Value Date   HGB 12.7 01/09/2013  January 08, 2019 CMP normal, CBC hemoglobin 9.4, MCV 66, vitamin D 36.4, TSH normal, magnesium normal, ferritin 5, FSH normal, LH normal  Speciality Comments: No specialty comments available.  Proced ures:  No procedures performed Allergies: Biaxin [clarithromycin], Cymbalta [duloxetine hcl], Erythromycin, Lexapro [escitalopram oxalate], and Penicillins   Assessment / Plan:     Visit Diagnoses: Muscle spasm -patient has been having episodic muscle spasms since November 2019.  Initially the muscle spasm improved by taking Flexeril.  Now the spasms have recurred since she restarted her work.  She states the spasms could be severe and mostly occurs at nighttime.  Doing certain activities of exercising springs on the spasms.  She had no muscular weakness or tenderness on examination.  Her DTRs were intact.  Her neurological work-up in the past has been negative.  I reviewed her chart from neurology.  It shows the  results of MRI of the cervical spine and the brain was also normal.  Patient never did get EMG.  I would recommend scheduling an appointment to get EMG.  01/08/19: TSH 1.37, vitamin D 36 - Plan: CK, Aldolase  Other fatigue -patient is concerned about possible lupus.  She has no clinical features of lupus.  I will get ANA today per her request.  Plan: ANA  Other iron deficiency anemia-I noticed iron deficiency anemia in her blood work.  Ferritin deficiency sometimes can cause restless leg syndrome.  I am uncertain if her symptoms are typical for restless leg syndrome.  Although it will be important to correct the iron deficiency anemia.  She does have heavy periods although she denies any blood in her stool.  B12 deficiency -patient gives history of B12 deficiency and has been taking B12 shots.  As she does have B12 deficiency, vitamin D deficiency and iron deficiency I will obtain labs for possible celiac disease.  Plan: Tissue transglutaminase, IgA, Gliadin antibodies, serum  Vitamin D deficiency -patient reports her vitamin D was low.  She was treated for vitamin D deficiency and the most recent vitamin D was normal.  Plan: Tissue transglutaminase, IgA, Gliadin antibodies, serum  History of diverticulosis  History of gastroesophageal reflux (GERD)  Palpitations-patient had extensive work-up by cardiology in the past.  History of IBS-patient reports having colonoscopy in the past.  She still has chronic diarrhea.  History of anxiety-patient has tried Cymbalta but could not tolerate the side effects.  Orders: Orders Placed This Encounter  Procedures  . CK  . Aldolase  . Tissue transglutaminase, IgA  . Gliadin antibodies, serum  . ANA   No orders of the defined types were placed in this encounter.   Face-to-face time spent with patient was 45 minutes. Greater than 50% of time was spent in counseling and coordination of care.  Follow-Up Instructions: Return for myalgia.   Bo Merino, MD  Note - This record has been created using Editor, commissioning.  Chart creation errors  have been sought, but may not always  have been located. Such creation errors do not reflect on  the standard of medical care.

## 2019-01-24 LAB — GLIADIN ANTIBODIES, SERUM
Gliadin IgA: <1 Units
Gliadin IgG: <1 Units

## 2019-01-24 LAB — ALDOLASE: Aldolase: 4.7 U/L (ref <=8.1)

## 2019-01-24 LAB — TISSUE TRANSGLUTAMINASE, IGA: (tTG) Ab, IgA: 2 U/mL

## 2019-01-24 LAB — ANA: Anti Nuclear Antibody (ANA): NEGATIVE

## 2019-01-24 LAB — CK: Total CK: 58 U/L (ref 29–143)

## 2019-02-06 DIAGNOSIS — N938 Other specified abnormal uterine and vaginal bleeding: Secondary | ICD-10-CM | POA: Insufficient documentation

## 2019-02-07 DIAGNOSIS — K635 Polyp of colon: Secondary | ICD-10-CM | POA: Insufficient documentation

## 2019-02-11 NOTE — Progress Notes (Deleted)
Office Visit Note  Patient: Lynn Spears             Date of Birth: 12-21-1975           MRN: [id number]             PCP: Curlene Labrum, MD Referring: Curlene Labrum, MD Visit Date: 02/22/2019 Occupation: @GUAROCC @  Subjective:  No chief complaint on file.   History of Present Illness: Lynn Spears is a 43 y.o. female ***   Activities of Daily Living:  Patient reports morning stiffness for *** {minute/hour:19697}.   Patient {ACTIONS;DENIES/REPORTS:21021675::"Denies"} nocturnal pain.  Difficulty dressing/grooming: {ACTIONS;DENIES/REPORTS:21021675::"Denies"} Difficulty climbing stairs: {ACTIONS;DENIES/REPORTS:21021675::"Denies"} Difficulty getting out of chair: {ACTIONS;DENIES/REPORTS:21021675::"Denies"} Difficulty using hands for taps, buttons, cutlery, and/or writing: {ACTIONS;DENIES/REPORTS:21021675::"Denies"}  No Rheumatology ROS completed.   PMFS History:  Patient Active Problem List   Diagnosis Date Noted  . Muscle spasm 04/10/2018  . Palpitations 11/05/2013  . Elevated blood pressure 11/05/2013  . Family history of premature CAD 11/05/2013    Past Medical History:  Diagnosis Date  . Allergic rhinitis   . Allergy   . Anemia   . Anxiety   . Chronic headaches   . Colon polyps    adenomatous  . Diverticulosis   . Elevated blood pressure   . GERD (gastroesophageal reflux disease)   . Hypertension   . IBS (irritable bowel syndrome)   . Palpitations     Family History  Problem Relation Age of Onset  . Hypertension Mother   . CAD Mother        CABG at age 43  . Colon polyps Mother 62       pre-cancerous  . Diabetes Maternal Grandmother   . Stroke Maternal Grandmother   . Colon cancer Maternal Grandmother        in her 46's  . CAD Father        CABG in his late 24s  . Ovarian cancer Maternal Aunt   . Throat cancer Maternal Uncle   . Pancreatic cancer Maternal Aunt   . Scoliosis Daughter   . Healthy Daughter   . Esophageal cancer Neg Hx   .  Rectal cancer Neg Hx   . Stomach cancer Neg Hx    Past Surgical History:  Procedure Laterality Date  . WISDOM TOOTH EXTRACTION     Social History   Social History Narrative   Lives at home with her husband.   Right-handed.   1 cup caffeine per day.    There is no immunization history on file for this patient.   Objective: Vital Signs: There were no vitals taken for this visit.   Physical Exam   Musculoskeletal Exam: ***  CDAI Exam: CDAI Score: - Patient Global: -; Provider Global: - Swollen: -; Tender: - Joint Exam   No joint exam has been documented for this visit   There is currently no information documented on the homunculus. Go to the Rheumatology activity and complete the homunculus joint exam.  Investigation: No additional findings.  Imaging: No results found.  Recent Labs: Lab Results  Component Value Date   HGB 12.7 01/09/2013  January 22, 2019 antigliadin negative, anti-tTG negative, CK 58, aldolase 4.7, ANA negative  Speciality Comments: No specialty comments available.  Procedures:  No procedures performed Allergies: Biaxin [clarithromycin], Cymbalta [duloxetine hcl], Erythromycin, Lexapro [escitalopram oxalate], and Penicillins   Assessment / Plan:     Visit Diagnoses: No diagnosis found.  Orders: No orders of the defined types were  placed in this encounter.  No orders of the defined types were placed in this encounter.   Face-to-face time spent with patient was *** minutes. Greater than 50% of time was spent in counseling and coordination of care.  Follow-Up Instructions: No follow-ups on file.   Bo Merino, MD  Note - This record has been created using Editor, commissioning.  Chart creation errors have been sought, but may not always  have been located. Such creation errors do not reflect on  the standard of medical care.

## 2019-02-15 ENCOUNTER — Other Ambulatory Visit: Payer: Self-pay

## 2019-02-18 ENCOUNTER — Other Ambulatory Visit: Payer: Self-pay

## 2019-02-18 ENCOUNTER — Encounter: Payer: Self-pay | Admitting: Obstetrics and Gynecology

## 2019-02-18 ENCOUNTER — Ambulatory Visit: Payer: BC Managed Care – PPO | Admitting: Obstetrics and Gynecology

## 2019-02-18 VITALS — BP 120/70 | HR 88 | Temp 98.0°F | Resp 16 | Ht 66.5 in | Wt 190.4 lb

## 2019-02-18 DIAGNOSIS — D509 Iron deficiency anemia, unspecified: Secondary | ICD-10-CM

## 2019-02-18 DIAGNOSIS — D219 Benign neoplasm of connective and other soft tissue, unspecified: Secondary | ICD-10-CM

## 2019-02-18 NOTE — Patient Instructions (Signed)

## 2019-02-18 NOTE — Progress Notes (Addendum)
GYNECOLOGY  VISIT   HPI: 43 y.o.   Married  Caucasian  female   G1P1001 with Patient's last menstrual period was 02/04/2019 (exact date).   here for uterine fibroids and anemia.  Since November, 2019, she has had medical issues.  She developed left sided muscular spasms 2 weeks following a flu vaccine.  She has a dull ache on her left side, sciatica.  She saw [facility] and Dr. Nelson Chimes.  Ultimately, she was dx with anemia.  She saw hematology/oncology and she had an ultrasound showing fibroids.   She does have heavy menstruation.  Menses are every 28 - 30 days.  Last 5 - 7 days.  Flow is heavy for 2 -3 days in the middle.  Uses ultra tampon and back up pad and changes every 6 hours.  No staining through clothing unless at the verge of needing to change.   No pain with periods, but does have cramping.   Declines future childbearing.   She now has an iron infusion next week. Hgb went from  9 - 10.  Her ferritin is very low.  Difficulty taking oral iron.   Colonoscopy normal in 2017.   Found polyps.  She is due again in 2022.  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   GYNECOLOGIC HISTORY: Patient's last menstrual period was 02/04/2019 (exact date). Contraception: Condoms everytime Menopausal hormone therapy:  none Last mammogram:  12-02-16 Neg/density B/BiRads1 Last pap smear:   09-10-14 Neg:Neg HR HPV        OB History    Gravida  1   Para  1   Term  1   Preterm  0   AB  0   Living  1     SAB  0   TAB  0   Ectopic  0   Multiple  0   Live Births  1              Patient Active Problem List   Diagnosis Date Noted  . Muscle spasm 04/10/2018  . Palpitations 11/05/2013  . Elevated blood pressure 11/05/2013  . Family history of premature CAD 11/05/2013    Past Medical History:  Diagnosis Date  . Allergic rhinitis   . Allergy   . Anemia   . Anxiety   . Chronic headaches   . Colon polyps    adenomatous  .  Diverticulosis   . Elevated blood pressure   . Fibroid   . GERD (gastroesophageal reflux disease)   . Hypertension   . IBS (irritable bowel syndrome)   . Palpitations     Past Surgical History:  Procedure Laterality Date  . WISDOM TOOTH EXTRACTION      Current Outpatient Medications  Medication Sig Dispense Refill  . cetirizine (ZYRTEC) 10 MG tablet Take 10 mg by mouth daily.    . Cholecalciferol (D 5000) 125 MCG (5000 UT) capsule Take 1 capsule by mouth daily.    . cyclobenzaprine (FLEXERIL) 10 MG tablet as needed.    . gabapentin (NEURONTIN) 100 MG capsule 100-200 mg at bedtime.    . metoprolol succinate (TOPROL-XL) 25 MG 24 hr tablet Take 25 mg by mouth daily.     Marland Kitchen OVER THE COUNTER MEDICATION OTC iron-Takes 1 tablet daily     No current facility-administered medications for this visit.      ALLERGIES: Biaxin [clarithromycin], Cymbalta [duloxetine hcl], Erythromycin, Lexapro [escitalopram oxalate], Penicillins, and Sulfa antibiotics  Family History  Problem Relation Age  of Onset  . Hypertension Mother   . CAD Mother        CABG at age 88  . Colon polyps Mother 66       pre-cancerous  . Diabetes Maternal Grandmother   . Stroke Maternal Grandmother   . Colon cancer Maternal Grandmother        in her 24's  . CAD Father        CABG in his late 85s  . Ovarian cancer Maternal Aunt   . Throat cancer Maternal Uncle   . Pancreatic cancer Maternal Aunt   . Scoliosis Daughter   . Healthy Daughter   . Esophageal cancer Neg Hx   . Rectal cancer Neg Hx   . Stomach cancer Neg Hx     Social History   Socioeconomic History  . Marital status: Married    Spouse name: Not on file  . Number of children: 1  . Years of education: some college  . Highest education level: Not on file  Occupational History  . Occupation: Network engineer  Social Needs  . Financial resource strain: Not on file  . Food insecurity    Worry: Not on file    Inability: Not on file  . Transportation  needs    Medical: Not on file    Non-medical: Not on file  Tobacco Use  . Smoking status: Never Smoker  . Smokeless tobacco: Never Used  Substance and Sexual Activity  . Alcohol use: Yes    Comment: socially  . Drug use: No  . Sexual activity: Yes    Partners: Male    Birth control/protection: Condom    Comment: condoms everytime/withdrawsl  Lifestyle  . Physical activity    Days per week: Not on file    Minutes per session: Not on file  . Stress: Not on file  Relationships  . Social Herbalist on phone: Not on file    Gets together: Not on file    Attends religious service: Not on file    Active member of club or organization: Not on file    Attends meetings of clubs or organizations: Not on file    Relationship status: Not on file  . Intimate partner violence    Fear of current or ex partner: Not on file    Emotionally abused: Not on file    Physically abused: Not on file    Forced sexual activity: Not on file  Other Topics Concern  . Not on file  Social History Narrative   Lives at home with her husband.   Right-handed.   1 cup caffeine per day.    Review of Systems  All other systems reviewed and are negative.   PHYSICAL EXAMINATION:    BP 120/70 (Cuff Size: Large)   Pulse 88   Temp 98 F (36.7 C) (Temporal)   Resp 16   Ht 5' 6.5" (1.689 m)   Wt 190 lb 6.4 oz (86.4 kg)   LMP 02/04/2019 (Exact Date)   BMI 30.27 kg/m     General appearance: alert, cooperative and appears stated age Head: Normocephalic, without obvious abnormality, atraumatic Lungs: clear to auscultation bilaterally Heart: regular rate and rhythm Abdomen: soft, non-tender, no masses,  no organomegaly Extremities: extremities normal, atraumatic, no cyanosis or edema Skin: Skin color, texture, turgor normal. No rashes or lesions No abnormal inguinal nodes palpated Neurologic: Grossly normal  Pelvic: External genitalia:  no lesions  Urethra:  normal appearing  urethra with no masses, tenderness or lesions              Bartholins and Skenes: normal                 Vagina: normal appearing vagina with normal color and discharge, no lesions              Cervix: no lesions                Bimanual Exam:  Uterus:  8 week size. Mobile and nontender.               Adnexa: no mass, fullness, tenderness                 Chaperone was present for exam.  ASSESSMENT  Fibroids.  HTN.  Iron deficiency anemia.  I suspect this is multifactorial - fibroids, dietary.   PLAN  We discussed fibroids - signs and symptoms and tx in detail.  I reviewed progesterone options including possible Mirena IUD, uterine artery ablation, myomectomy and hysterectomy.  Get copy of pelvic US.  Proceed with iron infusion.  Return for annual exam.  She will update her mammogram.  An After Visit Summary was printed and given to the patient.  _40_____ minutes face to face time of which over 50% was spent in counseling.   ADDENDUM - Pelvic US report received from [facility] dated 02/12/19  Uterus with 2 fibroids: Left fundal, exophytic - 6.6 x 6.0 x 6.9 cm Left lateral mid uterus, exophytic - 4.1 x 3.9 x 3.3 cm. Neither fibroid are submucosal.  EMS 9 mm.  Left ovary normal.  Right ovary normal.  Trace free fluid.  No adnexal mass.

## 2019-02-22 ENCOUNTER — Ambulatory Visit: Payer: BC Managed Care – PPO | Admitting: Rheumatology

## 2019-02-22 ENCOUNTER — Encounter: Payer: Self-pay | Admitting: Obstetrics and Gynecology

## 2019-03-21 ENCOUNTER — Ambulatory Visit: Payer: BC Managed Care – PPO | Admitting: Obstetrics and Gynecology

## 2019-03-25 ENCOUNTER — Other Ambulatory Visit: Payer: Self-pay | Admitting: Obstetrics and Gynecology

## 2019-03-25 DIAGNOSIS — Z1231 Encounter for screening mammogram for malignant neoplasm of breast: Secondary | ICD-10-CM

## 2019-03-26 ENCOUNTER — Ambulatory Visit: Payer: BC Managed Care – PPO | Admitting: Rheumatology

## 2019-03-28 ENCOUNTER — Telehealth: Payer: Self-pay | Admitting: Obstetrics and Gynecology

## 2019-03-28 NOTE — Telephone Encounter (Signed)
Patient is calling regarding her annual exam. Patient stated that she is to start her cycle within the next couple of days and that she has heavy cycles. Patient cancelled annual exam at time of call. Patient stated that Dr. Quincy Simmonds was wanting to see her regarding follow up to fibroid imaging that she had done.

## 2019-03-28 NOTE — Telephone Encounter (Signed)
Left message to call triage nurse at [facility].

## 2019-03-28 NOTE — Telephone Encounter (Signed)
Pt returned call. Spoke with pt. Pt started cycle 11/4 and was concerned of having AEX on 11/9 d/t cycles being heavy.   Rescheduled AEX with Dr Quincy Simmonds 04/29/19 at 4pm. Pt also states needing to discuss f/u from [facility] on 02/12/19 at this appt. Scanned report from [facility] [place] in pts chart.  Routing to provider for final review. Patient is agreeable to disposition. Will close encounter.

## 2019-04-01 ENCOUNTER — Ambulatory Visit: Payer: BC Managed Care – PPO | Admitting: Obstetrics and Gynecology

## 2019-04-23 DIAGNOSIS — U071 COVID-19: Secondary | ICD-10-CM

## 2019-04-23 HISTORY — DX: COVID-19: U07.1

## 2019-04-24 ENCOUNTER — Other Ambulatory Visit: Payer: Self-pay

## 2019-04-24 DIAGNOSIS — Z20822 Contact with and (suspected) exposure to covid-19: Secondary | ICD-10-CM

## 2019-04-26 ENCOUNTER — Telehealth: Payer: Self-pay | Admitting: *Deleted

## 2019-04-26 NOTE — Telephone Encounter (Signed)
Pt notified that her covid-19 test had not resulted yet. She voiced understanding.

## 2019-04-27 LAB — NOVEL CORONAVIRUS, NAA: SARS-CoV-2, NAA: NOT DETECTED

## 2019-04-29 ENCOUNTER — Ambulatory Visit (INDEPENDENT_AMBULATORY_CARE_PROVIDER_SITE_OTHER): Payer: BC Managed Care – PPO | Admitting: Obstetrics and Gynecology

## 2019-04-29 ENCOUNTER — Other Ambulatory Visit: Payer: Self-pay

## 2019-04-29 ENCOUNTER — Encounter: Payer: Self-pay | Admitting: Obstetrics and Gynecology

## 2019-04-29 VITALS — BP 130/88 | HR 70 | Temp 97.2°F | Resp 14 | Ht 66.5 in | Wt 191.4 lb

## 2019-04-29 DIAGNOSIS — Z01419 Encounter for gynecological examination (general) (routine) without abnormal findings: Secondary | ICD-10-CM | POA: Diagnosis not present

## 2019-04-29 MED ORDER — NORETHINDRONE 0.35 MG PO TABS: 1.0000 | ORAL_TABLET | Freq: Every day | ORAL | 0 refills | Status: DC

## 2019-04-29 NOTE — Patient Instructions (Signed)

## 2019-04-29 NOTE — Progress Notes (Signed)
43 y.o. G18P1001 Married Caucasian female here for annual exam.    Patient has had 2 iron infusions through [facility]. She has follow up in February.  She has fibroids and anemia.  Cycles are heavy but not irregular.  Pelvic US report received from [facility] dated 02/12/19: Uterus with 2 fibroids: Left fundal, exophytic - 6.6 x 6.0 x 6.9 cm Left lateral mid uterus, exophytic - 4.1 x 3.9 x 3.3 cm. Neither fibroid are submucosal.  EMS 9 mm.  Left ovary normal.  Right ovary normal.  Trace free fluid.  No adnexal mass.   PCP: Judd Lien, MD    Patient's last menstrual period was 04/22/2019 (exact date).           Sexually active: Yes.    The current method of family planning is condoms everytime.    Exercising: Yes.    walking, housework Smoker:  no  Health Maintenance: Pap:  09/10/14 Pap and HR HPV negative History of abnormal Pap:  no MMG:  12/02/16 BIRADS 1 negative/density b--appt. 05/2019 Colonoscopy:  06/04/15 Polyp removed.  Due in 5 years.  BMD:   n/a  Result  n/a TDaP:  Unsure--declines Gardasil:   no HIV and Hep C: 04/29/15 Negative Screening Labs:  PCP.  Flu vaccine:  Had a neurologic reaction.   reports that she has never smoked. She has never used smokeless tobacco. She reports current alcohol use. She reports that she does not use drugs.  Past Medical History:  Diagnosis Date  . Allergic rhinitis   . Allergy   . Anemia    04-29-19 patient having iron infusions--[facility]  . Anxiety   . Chronic headaches   . Colon polyps    adenomatous  . Diverticulosis   . Elevated blood pressure   . Fibroid    Ultrasound 02/12/19 - [place] - left fundal 6.6 x 6.0 x 6.9 cm, left lateral 4.1 x 3.9 x 3.3 cm.   Marland Kitchen GERD (gastroesophageal reflux disease)   . Hypertension   . IBS (irritable bowel syndrome)   . Palpitations     Past Surgical History:  Procedure Laterality Date  . WISDOM TOOTH EXTRACTION      Current Outpatient Medications   Medication Sig Dispense Refill  . cetirizine (ZYRTEC) 10 MG tablet Take 10 mg by mouth daily.    . Cholecalciferol (D 5000) 125 MCG (5000 UT) capsule Take 1 capsule by mouth daily.    . clonazePAM (KLONOPIN) 0.5 MG tablet Take 0.5 mg by mouth 2 (two) times daily as needed.    . cyclobenzaprine (FLEXERIL) 10 MG tablet as needed.    . gabapentin (NEURONTIN) 100 MG capsule 100-200 mg at bedtime.    . metoprolol succinate (TOPROL-XL) 25 MG 24 hr tablet Take 25 mg by mouth daily.     Marland Kitchen PRESCRIPTION MEDICATION Iron Infusion    . norethindrone (MICRONOR) 0.35 MG tablet Take 1 tablet (0.35 mg total) by mouth daily. 3 Package 0   No current facility-administered medications for this visit.     Family History  Problem Relation Age of Onset  . Hypertension Mother   . CAD Mother        CABG at age 60  . Colon polyps Mother 83       pre-cancerous  . Polycystic kidney disease Mother   . Diabetes Maternal Grandmother   . Stroke Maternal Grandmother   . Colon cancer Maternal Grandmother        in her  68's  . CAD Father        CABG in his late 15s  . Ovarian cancer Maternal Aunt   . Throat cancer Maternal Uncle   . Pancreatic cancer Maternal Aunt   . Scoliosis Daughter   . Healthy Daughter   . Esophageal cancer Neg Hx   . Rectal cancer Neg Hx   . Stomach cancer Neg Hx     Review of Systems  All other systems reviewed and are negative.   Exam:   BP 130/88 (Cuff Size: Large)   Pulse 70   Temp (!) 97.2 F (36.2 C) (Temporal)   Resp 14   Ht 5' 6.5" (1.689 m)   Wt 191 lb 6.4 oz (86.8 kg)   LMP 04/22/2019 (Exact Date)   BMI 30.43 kg/m     General appearance: alert, cooperative and appears stated age Head: normocephalic, without obvious abnormality, atraumatic Neck: no adenopathy, supple, symmetrical, trachea midline and thyroid normal to inspection and palpation Lungs: clear to auscultation bilaterally Breasts: normal appearance, no masses or tenderness, No nipple retraction or  dimpling, No nipple discharge or bleeding, No axillary adenopathy Heart: regular rate and rhythm Abdomen: soft, non-tender; no masses, no organomegaly Extremities: extremities normal, atraumatic, no cyanosis or edema Skin: skin color, texture, turgor normal. No rashes or lesions Lymph nodes: cervical, supraclavicular, and axillary nodes normal. Neurologic: grossly normal  Pelvic: External genitalia:  no lesions              No abnormal inguinal nodes palpated.              Urethra:  normal appearing urethra with no masses, tenderness or lesions              Bartholins and Skenes: normal                 Vagina: normal appearing vagina with normal color and discharge, no lesions              Cervix: no lesions              Pap taken: No. Bimanual Exam:  Uterus: 11 week size, more weight on the left side.               Adnexa: no mass, fullness, tenderness              Rectal exam: Yes.  .  Confirms.              Anus:  normal sphincter tone, no lesions  Chaperone was present for exam.  Assessment:   Well woman visit with normal exam. HTN.  Fibroids. 2 large fibroids.  Plan: Mammogram screening discussed. Self breast awareness reviewed. Pap and HR HPV as above. Guidelines for Calcium, Vitamin D, regular exercise program including cardiovascular and weight bearing exercise. We discussed potential Mirena, Micronor, Depo Provera, and procedures like uterine artery embolization and laparoscopic hysterectomy.  Micronor 3 packs.  She will let me know how the Micronor is working and if she would like to continue.  Follow up annually and prn.   After visit summary provided.

## 2019-05-20 ENCOUNTER — Ambulatory Visit: Payer: BC Managed Care – PPO

## 2019-07-14 ENCOUNTER — Other Ambulatory Visit: Payer: Self-pay | Admitting: Obstetrics and Gynecology

## 2019-07-15 NOTE — Telephone Encounter (Signed)
Medication refill request: micronor Last AEX:  04-29-2019 Next AEX: 04-30-2020 Last MMG (if hormonal medication request): 12-05-2016 category b density birads 1:neg Refill authorized: patient was to callback after 66mths to let us know if she wanted to continue the micronor. Left message for callback & also to check on if she has updated her mmg.

## 2019-07-16 ENCOUNTER — Encounter: Payer: Self-pay | Admitting: Cardiology

## 2019-07-16 ENCOUNTER — Encounter: Payer: Self-pay | Admitting: *Deleted

## 2019-07-16 NOTE — Progress Notes (Deleted)
Cardiology Office Note  Date: 07/16/2019   ID: Lynn Spears, DOB [birth year], MRN [id number]  PCP:  Curlene Labrum, MD  Consulting Cardiologist:  Rozann Lesches, MD Electrophysiologist:  None   No chief complaint on file.   History of Present Illness: Lynn Spears is a 44 y.o. female referred for cardiology consultation by Ms. Skillman PA-C for the evaluation of palpitations.  Toprol-XL was just increased to 75 mg daily as of February 12.  She was evaluated back in 2015 for palpitations.  Cardiac monitor at that time showed no arrhythmias and she also underwent a GXT that was low risk.  I reviewed lab work from August 2020 obtained by [facility] as outlined below.  Past Medical History:  Diagnosis Date  . Allergic rhinitis   . Allergy   . Anemia    04-29-19 patient having iron infusions--[facility]  . Anxiety   . Chronic headaches   . Diverticulosis   . Essential hypertension   . Fibroid    Ultrasound 02/12/19 - [place] - left fundal 6.6 x 6.0 x 6.9 cm, left lateral 4.1 x 3.9 x 3.3 cm.   Marland Kitchen GERD (gastroesophageal reflux disease)   . Hx of adenomatous colonic polyps   . IBS (irritable bowel syndrome)   . Palpitations     Past Surgical History:  Procedure Laterality Date  . WISDOM TOOTH EXTRACTION      Current Outpatient Medications  Medication Sig Dispense Refill  . cetirizine (ZYRTEC) 10 MG tablet Take 10 mg by mouth daily.    . Cholecalciferol (D 5000) 125 MCG (5000 UT) capsule Take 1 capsule by mouth daily.    . clonazePAM (KLONOPIN) 0.5 MG tablet Take 0.5 mg by mouth 2 (two) times daily as needed.    . cyclobenzaprine (FLEXERIL) 10 MG tablet as needed.    . gabapentin (NEURONTIN) 100 MG capsule 100-200 mg at bedtime.    . metoprolol succinate (TOPROL-XL) 25 MG 24 hr tablet Take 25 mg by mouth daily.     . norethindrone (MICRONOR) 0.35 MG tablet Take 1 tablet (0.35 mg total) by mouth daily. 3 Package 0  . PRESCRIPTION MEDICATION Iron Infusion      No current facility-administered medications for this visit.   Allergies:  Biaxin [clarithromycin], Cymbalta [duloxetine hcl], Erythromycin, Flu virus vaccine, Lexapro [escitalopram oxalate], Penicillins, and Sulfa antibiotics   Social History: The patient  reports that she has never smoked. She has never used smokeless tobacco. She reports current alcohol use. She reports that she does not use drugs.   Family History: The patient's family history includes CAD in her father and mother; Colon cancer in her maternal grandmother; Colon polyps (age of onset: 62) in her mother; Diabetes in her maternal grandmother; Healthy in her daughter; Hypertension in her mother; Ovarian cancer in her maternal aunt; Pancreatic cancer in her maternal aunt; Polycystic kidney disease in her mother; Scoliosis in her daughter; Stroke in her maternal grandmother; Throat cancer in her maternal uncle.   ROS:  Please see the history of present illness. Otherwise, complete review of systems is positive for {NONE DEFAULTED:18576::"none"}.  All other systems are reviewed and negative.   Physical Exam: VS:  There were no vitals taken for this visit., BMI There is no height or weight on file to calculate BMI.  Wt Readings from Last 3 Encounters:  04/29/19 191 lb 6.4 oz (86.8 kg)  02/18/19 190 lb 6.4 oz (86.4 kg)  01/22/19 192 lb 3.2 oz (87.2  kg)    General: Patient appears comfortable at rest. HEENT: Conjunctiva and lids normal, oropharynx clear with moist mucosa. Neck: Supple, no elevated JVP or carotid bruits, no thyromegaly. Lungs: Clear to auscultation, nonlabored breathing at rest. Cardiac: Regular rate and rhythm, no S3 or significant systolic murmur, no pericardial rub. Abdomen: Soft, nontender, no hepatomegaly, bowel sounds present, no guarding or rebound. Extremities: No pitting edema, distal pulses 2+. Skin: Warm and dry. Musculoskeletal: No kyphosis. Neuropsychiatric: Alert and oriented x3, affect grossly  appropriate.  ECG:  An ECG dated 07/05/2019 was personally reviewed today and demonstrated:  Normal sinus rhythm.  Recent Labwork:  August 2020: Hemoglobin 9.4, platelets 450, TSH 1.37, magnesium 2.0, iron 12, iron percent saturation 3, ferritin 5-, creatinine 0.83, potassium 4.0, AST 20, ALT 21  Other Studies Reviewed Today:  Cardiac monitor October 2015  Sinus rhythm with no significant arrhythmias or pauses.  GXT 11/12/2013: Baseline tracing demonstrated normal sinus rhythm, HR 78 bpm. With exercise, there were no diagnostic ST segment or T wave abnormalities, nor any arrhythmias. Duke treadmill score of 8.5, indicating a low risk for major cardiac events.  Assessment and Plan:    Medication Adjustments/Labs and Tests Ordered: Current medicines are reviewed at length with the patient today.  Concerns regarding medicines are outlined above.   Tests Ordered: No orders of the defined types were placed in this encounter.   Medication Changes: No orders of the defined types were placed in this encounter.   Disposition:  Follow up {follow up:15908}  Signed, Satira Sark, MD, [facility] 07/16/2019 2:55 PM    [place] [facility] [address], [place], [place] [address] Phone: [phone]; Fax: [phone]

## 2019-07-16 NOTE — Telephone Encounter (Signed)
Patient states she doesn't need another refill right now. Patient to callback later & let us know how she is doing on her micronor. Patient states she had her mmg scheduled for December & got covid & she hasn't rescheduled it yet. She is aware of the importance & agrees to do it.

## 2019-07-17 ENCOUNTER — Ambulatory Visit: Payer: BC Managed Care – PPO | Admitting: Cardiology

## 2019-08-05 ENCOUNTER — Ambulatory Visit: Payer: BC Managed Care – PPO | Admitting: Cardiology

## 2019-08-05 ENCOUNTER — Other Ambulatory Visit: Payer: Self-pay

## 2019-08-05 ENCOUNTER — Encounter: Payer: Self-pay | Admitting: Cardiology

## 2019-08-05 ENCOUNTER — Telehealth: Payer: Self-pay | Admitting: Cardiology

## 2019-08-05 VITALS — BP 144/100 | HR 72 | Ht 67.0 in | Wt 192.8 lb

## 2019-08-05 DIAGNOSIS — I10 Essential (primary) hypertension: Secondary | ICD-10-CM

## 2019-08-05 DIAGNOSIS — R0602 Shortness of breath: Secondary | ICD-10-CM

## 2019-08-05 DIAGNOSIS — R002 Palpitations: Secondary | ICD-10-CM

## 2019-08-05 DIAGNOSIS — Z8616 Personal history of COVID-19: Secondary | ICD-10-CM

## 2019-08-05 NOTE — Progress Notes (Signed)
Cardiology Office Note  Date: 08/05/2019   ID: Lynn Spears, DOB 02-04-1976, MRN [id number]  PCP:  Curlene Labrum, MD  Cardiologist:  Rozann Lesches, MD Electrophysiologist:  None   Chief Complaint  Patient presents with  . Palpitations    History of Present Illness: Lynn Spears is a 44 y.o. female referred for cardiology consultation by Ms. Skillman PA-C for the evaluation of palpitations.  She regularly sees Dr. Pleas Koch.  She states that she was diagnosed with COVID-19 back in late December 2020, had symptoms for weeks, mainly flulike and also chest congestion, she was not hospitalized.  She felt better by February.  She states that her blood pressure was up over baseline during this time, and remains so now.  She also was feeling more regular palpitations, although has a history of palpitations over the years.  She states that she was diagnosed with hypertension about 5 to 6 years ago, has been on beta-blocker for concurrent management of palpitations.  Toprol-XL dose was recently increased.  She describes a sense of intermittent rapid heart rate, in the past her palpitations have been more described as a skipping sensation.  She has had no chest pain or syncope.  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  I reviewed her medications which are outlined below.  Past Medical History:  Diagnosis Date  . Allergic rhinitis   . Anemia    04-29-19 patient having iron infusions--[facility]  . Anxiety   . Chronic headaches   . Diverticulosis   . Essential hypertension   . Fibroid    Ultrasound 02/12/19 - [place] - left fundal 6.6 x 6.0 x 6.9 cm, left lateral 4.1 x 3.9 x 3.3 cm.   Marland Kitchen GERD (gastroesophageal reflux disease)   . Hx of adenomatous colonic polyps   . IBS (irritable bowel syndrome)   . Palpitations   . Seasonal allergies     Past Surgical History:  Procedure Laterality Date  . WISDOM TOOTH EXTRACTION      Current Outpatient  Medications  Medication Sig Dispense Refill  . cetirizine (ZYRTEC) 10 MG tablet Take 10 mg by mouth daily.    . Cholecalciferol (D 5000) 125 MCG (5000 UT) capsule Take 1 capsule by mouth daily.    . clonazePAM (KLONOPIN) 0.5 MG tablet Take 0.5 mg by mouth 2 (two) times daily as needed.    . cyclobenzaprine (FLEXERIL) 10 MG tablet Take 10 mg by mouth 3 (three) times daily as needed.     . gabapentin (NEURONTIN) 100 MG capsule Take 300 mg by mouth at bedtime as needed. Take 100-300 mg at bedtime as needed    . metoprolol succinate (TOPROL-XL) 25 MG 24 hr tablet Take 25 mg by mouth daily.     . metoprolol succinate (TOPROL-XL) 50 MG 24 hr tablet Take 50 mg by mouth daily.     No current facility-administered medications for this visit.   Allergies:  Biaxin [clarithromycin], Cymbalta [duloxetine hcl], Erythromycin, Flu virus vaccine, Lexapro [escitalopram oxalate], Macrobid [nitrofurantoin], Penicillins, and Sulfa antibiotics   Social History: The patient  reports that she has never smoked. She has never used smokeless tobacco. She reports current alcohol use. She reports that she does not use drugs.   Family History: The patient's family history includes CAD in her father and mother; Colon cancer in her maternal grandmother; Colon polyps (age of onset: 65) in her mother; Diabetes in her maternal grandmother; Healthy in her daughter;  Hypertension in her mother; Ovarian cancer in her maternal aunt; Pancreatic cancer in her maternal aunt; Polycystic kidney disease in her mother; Scoliosis in her daughter; Stroke in her maternal grandmother; Throat cancer in her maternal uncle.   ROS:  Reports intermittent pain/spasms on the left side of her body, pending evaluation with neurology.  Physical Exam: VS:  BP (!) 144/100 (BP Location: Right Arm, Cuff Size: Normal)   Pulse 72   Ht 5\' 7"  (1.702 m)   Wt 192 lb 12.8 oz (87.5 kg)   SpO2 98%   BMI 30.20 kg/m , BMI Body mass index is 30.2 kg/m.  Wt  Readings from Last 3 Encounters:  08/05/19 192 lb 12.8 oz (87.5 kg)  04/29/19 191 lb 6.4 oz (86.8 kg)  02/18/19 190 lb 6.4 oz (86.4 kg)    General: Patient appears comfortable at rest. HEENT: Conjunctiva and lids normal, wearing a mask. Neck: Supple, no elevated JVP or carotid bruits, no thyromegaly. Lungs: Clear to auscultation, nonlabored breathing at rest. Cardiac: Regular rate and rhythm, no S3 or significant systolic murmur, no pericardial rub. Abdomen: Soft, bowel sounds present. Extremities: No pitting edema, distal pulses 2+. Skin: Warm and dry. Musculoskeletal: No kyphosis. Neuropsychiatric: Alert and oriented x3, affect grossly appropriate.  ECG:  An ECG dated 07/05/2019 was personally reviewed today and demonstrated:  Normal sinus rhythm.  Recent Labwork:  November 2020: Hemoglobin 13.1, platelets 398, BUN 10, creatinine 0.75, potassium 4.4, AST 21, ALT 29  Other Studies Reviewed Today:  No prior cardiac testing for review today.  Assessment and Plan:  1.  Intermittent palpitations, reportedly with longstanding history.  No associated chest pain or syncope.  I reviewed her ECG from February showing normal sinus rhythm.  She has been on beta-blocker therapy long-term, dose recently increased with concurrent elevations in blood pressure.  We will obtain a follow-up 7-day Zio patch to ensure no changes and rhythm.  2.  History of essential hypertension, blood pressure has been elevated over baseline.  She is on Toprol-XL per PCP.  She does admit that she has not been as active, also had COVID-19 with prolonged symptoms as discussed above.  We will obtain an echocardiogram to ensure no evidence of cardiomyopathy.  I talked with her about a walking plan, salt restriction in the diet.  She may ultimately need combination therapy for control of blood pressure if lifestyle measures are not effective.   Medication Adjustments/Labs and Tests Ordered: Current medicines are reviewed  at length with the patient today.  Concerns regarding medicines are outlined above.   Tests Ordered: Orders Placed This Encounter  Procedures  . LONG TERM MONITOR (3-14 DAYS)  . ECHOCARDIOGRAM COMPLETE    Medication Changes: No orders of the defined types were placed in this encounter.   Disposition:  Follow up test results.  Signed, Satira Sark, MD, [facility] 08/05/2019 9:36 AM    [place] [facility] [place], [place], [place] [address] Phone: [phone]; Fax: [phone]

## 2019-08-05 NOTE — Patient Instructions (Addendum)
Medication Instructions:   Your physician recommends that you continue on your current medications as directed. Please refer to the Current Medication list given to you today.  Labwork:  NONE  Testing/Procedures: Your physician has recommended that you wear an event monitor for 7 days. Event monitors are medical devices that record the heart's electrical activity. Doctors most often [place] these monitors to diagnose arrhythmias. Arrhythmias are problems with the speed or rhythm of the heartbeat. The monitor is a small, portable device. You can wear one while you do your normal daily activities. This is usually used to diagnose what is causing palpitations/syncope (passing out). [employer] will contact you about his monitor. Your physician has requested that you have an echocardiogram. Echocardiography is a painless test that uses sound waves to create images of your heart. It provides your doctor with information about the size and shape of your heart and how well your heart's chambers and valves are working. This procedure takes approximately one hour. There are no restrictions for this procedure.  Follow-Up:  Your physician recommends that you schedule a follow-up appointment in: pending.  Any Other Special Instructions Will Be Listed Below (If Applicable).  If you need a refill on your cardiac medications before your next appointment, please call your pharmacy.

## 2019-08-05 NOTE — Telephone Encounter (Signed)
Precert 7 Day ZIO AT dx: palpitations

## 2019-08-05 NOTE — Telephone Encounter (Signed)
  Precert needed for: Echo   Location: [facility] Echo    Date: September 04, 2019

## 2019-08-09 ENCOUNTER — Telehealth: Payer: Self-pay | Admitting: *Deleted

## 2019-08-09 NOTE — Telephone Encounter (Signed)
Patient contacted to see why ZIO monitor wasn't activated yet. Reports having a rash that she wants to heal before putting on on patch. Advised to contact [employer] to give them an update on her status. Verbalized understanding.

## 2019-08-12 ENCOUNTER — Ambulatory Visit (INDEPENDENT_AMBULATORY_CARE_PROVIDER_SITE_OTHER): Payer: BC Managed Care – PPO

## 2019-08-12 DIAGNOSIS — R002 Palpitations: Secondary | ICD-10-CM

## 2019-08-19 ENCOUNTER — Telehealth: Payer: Self-pay | Admitting: *Deleted

## 2019-08-19 NOTE — Telephone Encounter (Signed)
Patient is returning monitor due to skin irritation.  Representative was unable to give time span monitor was worn nor when patient contacted them to report this.

## 2019-08-28 ENCOUNTER — Telehealth: Payer: Self-pay | Admitting: *Deleted

## 2019-08-28 NOTE — Telephone Encounter (Signed)
-----   Message from Satira Sark, MD sent at 08/22/2019  4:37 PM EDT ----- Results reviewed.  Cardiac monitor was reassuring.  She had rare atrial and ventricular ectopy and a brief burst of SVT that was not likely symptom provoking.  Await results of echocardiogram.

## 2019-08-29 ENCOUNTER — Telehealth: Payer: Self-pay | Admitting: Neurology

## 2019-08-29 NOTE — Telephone Encounter (Signed)
A returning patient of Dr. Rhea Belton who is being referred for Numbness, weakness in left arm and leg. She is asking to switch her care from Dr. Krista Blue to Dr. Jaynee Eagles.   Please advise if ok to switch   Thank you

## 2019-08-29 NOTE — Telephone Encounter (Signed)
Dr. Krista Blue already evaluated her for exactlythis, I really don't think I can offer her anything Dr. Krista Blue would not. For continuity of care she should see Dr. Krista Blue or one of our NPs since she was already seen for this already thanks

## 2019-09-04 ENCOUNTER — Ambulatory Visit (INDEPENDENT_AMBULATORY_CARE_PROVIDER_SITE_OTHER): Payer: BC Managed Care – PPO

## 2019-09-04 ENCOUNTER — Other Ambulatory Visit: Payer: Self-pay

## 2019-09-04 DIAGNOSIS — R0602 Shortness of breath: Secondary | ICD-10-CM | POA: Diagnosis not present

## 2019-09-05 ENCOUNTER — Encounter: Payer: Self-pay | Admitting: Neurology

## 2019-09-05 NOTE — Telephone Encounter (Signed)
-----   Message from Satira Sark, MD sent at 09/05/2019  7:58 AM EDT ----- Results reviewed.  Please let her know that her echocardiogram was normal, LVEF 65 to 70%, no significant valvular abnormalities.

## 2019-09-10 ENCOUNTER — Encounter: Payer: Self-pay | Admitting: *Deleted

## 2019-09-10 NOTE — Telephone Encounter (Signed)
Letter mailed with results. 

## 2019-09-19 ENCOUNTER — Encounter: Payer: Self-pay | Admitting: Obstetrics and Gynecology

## 2019-09-19 ENCOUNTER — Telehealth: Payer: Self-pay

## 2019-09-19 ENCOUNTER — Other Ambulatory Visit: Payer: Self-pay

## 2019-09-19 ENCOUNTER — Ambulatory Visit: Payer: BC Managed Care – PPO | Admitting: Obstetrics and Gynecology

## 2019-09-19 VITALS — BP 138/88 | HR 70 | Temp 97.5°F | Ht 67.0 in | Wt 194.0 lb

## 2019-09-19 DIAGNOSIS — N926 Irregular menstruation, unspecified: Secondary | ICD-10-CM

## 2019-09-19 DIAGNOSIS — M545 Low back pain, unspecified: Secondary | ICD-10-CM

## 2019-09-19 DIAGNOSIS — D219 Benign neoplasm of connective and other soft tissue, unspecified: Secondary | ICD-10-CM

## 2019-09-19 DIAGNOSIS — N912 Amenorrhea, unspecified: Secondary | ICD-10-CM

## 2019-09-19 DIAGNOSIS — R7989 Other specified abnormal findings of blood chemistry: Secondary | ICD-10-CM

## 2019-09-19 HISTORY — DX: Other specified abnormal findings of blood chemistry: R79.89

## 2019-09-19 LAB — POCT URINALYSIS DIPSTICK
Bilirubin, UA: NEGATIVE
Blood, UA: NEGATIVE
Glucose, UA: NEGATIVE
Ketones, UA: NEGATIVE
Leukocytes, UA: NEGATIVE
Nitrite, UA: NEGATIVE
Protein, UA: NEGATIVE
Urobilinogen, UA: 0.2 E.U./dL
pH, UA: 5 (ref 5.0–8.0)

## 2019-09-19 LAB — POCT URINE PREGNANCY: Preg Test, Ur: NEGATIVE

## 2019-09-19 NOTE — Telephone Encounter (Signed)
Patient called wanting to cancel daughters appointment today (09/19/19) at 3pm and be placed in the time slot instead. Patient stated she has fibroids and experiencing constant UTIs, bladder spasms, not having a menstrual cycle, and feeling very fatigued.

## 2019-09-19 NOTE — Telephone Encounter (Signed)
Spoke with patient. Patient reports hx of recent recurrent UTIs, tx by PCP and no menses since 05/2019. Was seen in office on 04/2019 for AEX. Hx of 2 large fibroids, was started on POP. Patient states she took POP for a couple of wks, then stopped medication when she was Dx with Covid 19, never restarted medication. Has not been SA since approximately 03/2019.   Patient reports lower back pain and "bladder spasms" that started over the weekend. Took pyridium twice, bladder spasms have resolved. Patient reports lower back pain continues. Denies dysuria, urinary frequency/urgency, fever/chills, hematuria.   Covid 19 prescreen negative, precautions reviewed. OV scheduled for today at 4:30pm with Dr. Quincy Simmonds.   Routing to provider for final review. Patient is agreeable to disposition. Will close encounter.

## 2019-09-19 NOTE — Progress Notes (Signed)
GYNECOLOGY  VISIT   HPI: 44 y.o.   Married  Caucasian  female   G1P1001 with Patient's last menstrual period was 05/24/2019 (approximate).   here for possible UTI--pt.complaining of lower back pain and bladder spasms.  History of recurrent UTIs.  She had 2 UTI infections back to back.  She was treated and her symptoms resolved for 2 weeks and then recurred.  She was treated with Ceftin for the first infection and then Ciprofloxacin for the second infection.   Second infection was E Coli. She was intolerant of Macrobid which caused nausea, and this prompted the tx with Cipro. Her infections are not post intercourse.  She is having a lot of clear discharge but not dryness.   Patient took Micronor at the end of the year.  She got Covid in December, and she stopped her medication.   January had a menses, and she has not had a cycle since then.  No hot flashes or night sweats.   She is seeing a neurologist for numbness and tingling.   Urine Dip: Neg UPT: Neg  GYNECOLOGIC HISTORY: Patient's last menstrual period was 05/24/2019 (approximate). Contraception:None Menopausal hormone therapy:  Last mammogram: 12/02/16 BIRADS 1 negative/density b  Last pap smear:   09/10/14 Pap and HR HPV negative        OB History    Gravida  1   Para  1   Term  1   Preterm  0   AB  0   Living  1     SAB  0   TAB  0   Ectopic  0   Multiple  0   Live Births  1              Patient Active Problem List   Diagnosis Date Noted  . Muscle spasm 04/10/2018  . Palpitations 11/05/2013  . Elevated blood pressure 11/05/2013  . Family history of premature CAD 11/05/2013    Past Medical History:  Diagnosis Date  . Allergic rhinitis   . Anemia    04-29-19 patient having iron infusions--[facility]  . Anxiety   . Chronic headaches   . COVID-19 04/2019  . Diverticulosis   . Essential hypertension   . Fibroid    Ultrasound 02/12/19 - [facility] - left fundal 6.6 x 6.0 x 6.9  cm, left lateral 4.1 x 3.9 x 3.3 cm.   Marland Kitchen GERD (gastroesophageal reflux disease)   . Hx of adenomatous colonic polyps   . IBS (irritable bowel syndrome)   . Palpitations   . Seasonal allergies     Past Surgical History:  Procedure Laterality Date  . WISDOM TOOTH EXTRACTION      Current Outpatient Medications  Medication Sig Dispense Refill  . cetirizine (ZYRTEC) 10 MG tablet Take 10 mg by mouth daily.    . Cholecalciferol (D 5000) 125 MCG (5000 UT) capsule Take 1 capsule by mouth daily.    . clonazePAM (KLONOPIN) 0.5 MG tablet Take 0.5 mg by mouth 2 (two) times daily as needed.    . cyclobenzaprine (FLEXERIL) 10 MG tablet Take 10 mg by mouth 3 (three) times daily as needed.     . gabapentin (NEURONTIN) 100 MG capsule Take 300 mg by mouth at bedtime as needed. Take 100-300 mg at bedtime as needed    . metoprolol succinate (TOPROL-XL) 50 MG 24 hr tablet Take 50 mg by mouth daily.    . metoprolol succinate (TOPROL-XL) 25 MG 24 hr tablet Take 25  mg by mouth daily.      No current facility-administered medications for this visit.     ALLERGIES: Biaxin [clarithromycin], Cymbalta [duloxetine hcl], Erythromycin, Flu virus vaccine, Lexapro [escitalopram oxalate], Macrobid [nitrofurantoin], Penicillins, and Sulfa antibiotics  Family History  Problem Relation Age of Onset  . Hypertension Mother   . CAD Mother        CABG at age 15  . Colon polyps Mother 65       pre-cancerous  . Polycystic kidney disease Mother   . Diabetes Maternal Grandmother   . Stroke Maternal Grandmother   . Colon cancer Maternal Grandmother        in her 34's  . CAD Father        CABG in his late 37s  . Ovarian cancer Maternal Aunt   . Throat cancer Maternal Uncle   . Pancreatic cancer Maternal Aunt   . Scoliosis Daughter   . Healthy Daughter   . Esophageal cancer Neg Hx   . Rectal cancer Neg Hx   . Stomach cancer Neg Hx     Social History   Socioeconomic History  . Marital status: Married    Spouse  name: Not on file  . Number of children: 1  . Years of education: some college  . Highest education level: Not on file  Occupational History  . Occupation: Network engineer  Tobacco Use  . Smoking status: Never Smoker  . Smokeless tobacco: Never Used  Substance and Sexual Activity  . Alcohol use: Yes    Comment: Occasional  . Drug use: No  . Sexual activity: Not on file  Other Topics Concern  . Not on file  Social History Narrative   Lives at home with her husband.   Right-handed.   1 cup caffeine per day.   Social Determinants of Health   Financial Resource Strain:   . Difficulty of Paying Living Expenses:   Food Insecurity:   . Worried About Charity fundraiser in the Last Year:   . Arboriculturist in the Last Year:   Transportation Needs:   . Film/video editor (Medical):   Marland Kitchen Lack of Transportation (Non-Medical):   Physical Activity:   . Days of Exercise per Week:   . Minutes of Exercise per Session:   Stress:   . Feeling of Stress :   Social Connections:   . Frequency of Communication with Friends and Family:   . Frequency of Social Gatherings with Friends and Family:   . Attends Religious Services:   . Active Member of Clubs or Organizations:   . Attends Archivist Meetings:   Marland Kitchen Marital Status:   Intimate Partner Violence:   . Fear of Current or Ex-Partner:   . Emotionally Abused:   Marland Kitchen Physically Abused:   . Sexually Abused:     Review of Systems  Genitourinary: Positive for urgency.       Low back pain   All other systems reviewed and are negative.   PHYSICAL EXAMINATION:    BP 138/88 (Cuff Size: Large)   Pulse 70   Temp (!) 97.5 F (36.4 C) (Temporal)   Ht 5\' 7"  (1.702 m)   Wt 194 lb (88 kg)   LMP 05/24/2019 (Approximate)   BMI 30.38 kg/m     General appearance: alert, cooperative and appears stated age   Pelvic: External genitalia:  no lesions              Urethra:  normal appearing  urethra with no masses, tenderness or lesions               Bartholins and Skenes: normal                 Vagina: normal appearing vagina with normal color and discharge, no lesions              Cervix: no lesions                Bimanual Exam:  Uterus:  12 week size with fullness on the left.               Adnexa: no mass, fullness, tenderness         Chaperone was present for exam.  ASSESSMENT  Uterine fibroids. Pelvic spasm.  Uncertain etiology. Vaginitis.  Amenorrhea. Hx UTIs.  Urine normal today.  PLAN  Will check FSH, E2, prolactin, TSH.  Affirm.  Return for pelvic US to check fibroids and ovaries.   An After Visit Summary was printed and given to the patient.  ___20___ minutes face to face time of which over 50% was spent in counseling.

## 2019-09-20 ENCOUNTER — Encounter: Payer: Self-pay | Admitting: Obstetrics and Gynecology

## 2019-09-20 ENCOUNTER — Telehealth: Payer: Self-pay | Admitting: *Deleted

## 2019-09-20 DIAGNOSIS — R7989 Other specified abnormal findings of blood chemistry: Secondary | ICD-10-CM

## 2019-09-20 LAB — PROLACTIN: Prolactin: 23.9 ng/mL — ABNORMAL HIGH (ref 4.8–23.3)

## 2019-09-20 LAB — TSH: TSH: 1.5 u[IU]/mL (ref 0.450–4.500)

## 2019-09-20 LAB — VAGINITIS/VAGINOSIS, DNA PROBE
Candida Species: NEGATIVE
Gardnerella vaginalis: NEGATIVE
Trichomonas vaginosis: NEGATIVE

## 2019-09-20 LAB — ESTRADIOL: Estradiol: 254 pg/mL

## 2019-09-20 LAB — FOLLICLE STIMULATING HORMONE: FSH: 2.6 m[IU]/mL

## 2019-09-20 NOTE — Telephone Encounter (Signed)
Message left to return call to Triage Nurse at [phone].    

## 2019-09-20 NOTE — Telephone Encounter (Signed)
-----   Message from Nunzio Cobbs, MD sent at 09/20/2019 11:53 AM EDT ----- Please contact patient with results of her testing.  She is skipping her menstrual cycles.  She is not menopausal. Her thyroid function is normal. Her prolactin is slightly elevated, and this can cause irregular or skipped cycles. I recommend we retest this with a fasting prolactin which can be done when she returns to do a pelvic US in the office.   Her vaginitis testing is negative.

## 2019-09-23 ENCOUNTER — Telehealth: Payer: Self-pay | Admitting: Obstetrics and Gynecology

## 2019-09-23 NOTE — Telephone Encounter (Signed)
Spoke with patient regarding benefits for recommended ultrasound. Patient is aware that ultrasound is transvaginal. Patient acknowledges understanding of information presented. Patient is aware of cancellation policy.

## 2019-09-23 NOTE — Telephone Encounter (Signed)
Spoke with pt. Pt given results and recommendations per Dr Quincy Simmonds. Pt verbalized understanding.  Pt scheduled for PUS on 10/03/19 at 10 am with OV consult with Dr Quincy Simmonds to follow. Pt verbalized understanding. Pt to also have fasting Prolactin at this time. Pt aware of how to fast before Prolactin level. Pt agreeable. Future orders placed.   Routing to Dr Quincy Simmonds for review.  Encounter closed.  Cc: Alfonse Spruce for precert. Orders placed 09/19/19 by Dr Quincy Simmonds.

## 2019-09-23 NOTE — Telephone Encounter (Signed)
Call to patient. Per DPR, OK to leave message on voicemail.   Left voicemail requesting a return call to Hayley to review benefits and schedule recommended Pelvic ultrasound with Brook A. Silva, MD, FACOG 

## 2019-10-01 ENCOUNTER — Telehealth: Payer: Self-pay

## 2019-10-01 NOTE — Telephone Encounter (Signed)
Patient is calling in regards to appointment on (10/03/19). Patient states she is not having a menstrual cycle but is passing blood clots. Patient would like to speak to nurse in regards to this problem and to know if it will affect her appointment.

## 2019-10-01 NOTE — Telephone Encounter (Signed)
OV 09/19/19- per Dr Quincy Simmonds: Return for pelvic US to check fibroids and ovaries.  Spoke with pt. Pt reports having small intermittent clots that are size of ping pong ball, but most clots are smaller than that. Pt states thinks was starting cycle on 09/29/19 , placed tampon, no bleeding, just having clots with same clear discharge that was mentioned and noted from [facility] on 09/19/19. Pt only having clots when voiding not wearing or changing pad. Denies any UTI sx, fever, chills.   Pt advised can keep PUS for 10/03/19. Pt agreeable and verbalized understanding. Will update Dr Quincy Simmonds and return call if any additional recommendations. pt agreeable.   Routing to Dr Quincy Simmonds.

## 2019-10-01 NOTE — Telephone Encounter (Signed)
Ok to keep [place] appointment this week.  Her last Hgb in Care Everywhere is normal this month.

## 2019-10-03 ENCOUNTER — Other Ambulatory Visit (INDEPENDENT_AMBULATORY_CARE_PROVIDER_SITE_OTHER): Payer: BC Managed Care – PPO

## 2019-10-03 ENCOUNTER — Encounter: Payer: Self-pay | Admitting: Obstetrics and Gynecology

## 2019-10-03 ENCOUNTER — Other Ambulatory Visit: Payer: Self-pay

## 2019-10-03 ENCOUNTER — Ambulatory Visit (INDEPENDENT_AMBULATORY_CARE_PROVIDER_SITE_OTHER): Payer: BC Managed Care – PPO

## 2019-10-03 ENCOUNTER — Ambulatory Visit: Payer: BC Managed Care – PPO | Admitting: Obstetrics and Gynecology

## 2019-10-03 VITALS — BP 138/84 | HR 60 | Temp 98.0°F | Ht 67.0 in | Wt 194.0 lb

## 2019-10-03 DIAGNOSIS — R9389 Abnormal findings on diagnostic imaging of other specified body structures: Secondary | ICD-10-CM | POA: Diagnosis not present

## 2019-10-03 DIAGNOSIS — R7989 Other specified abnormal findings of blood chemistry: Secondary | ICD-10-CM

## 2019-10-03 DIAGNOSIS — D219 Benign neoplasm of connective and other soft tissue, unspecified: Secondary | ICD-10-CM | POA: Diagnosis not present

## 2019-10-03 NOTE — Progress Notes (Signed)
GYNECOLOGY  VISIT   HPI: 44 y.o.   Married  Caucasian  female   G1P1001 with Patient's last menstrual period was 09/29/2019 (exact date).   here for pelvic ultrasound and a prolactin recheck.   She has known fibroids and is having pelvic spasms.   She has lower back pain and bladder spasms.   She has been skipping her menses.  Her prolactin was 23.9. FSH 2.6.  When she took the Micronor, she did not have menstruation.  When she stopped taking it, she developed pelvic discomfort.  Patient has anemia and has recurrent low ferritin.  She will now have an endoscopy and colonoscopy.   GYNECOLOGIC HISTORY: Patient's last menstrual period was 09/29/2019 (exact date). Contraception:  none Menopausal hormone therapy:  n/a Last mammogram:12/02/16 BIRADS 1 negative/density b   Last pap smear: 09/10/14 Pap and HR HPV negative        OB History    Gravida  1   Para  1   Term  1   Preterm  0   AB  0   Living  1     SAB  0   TAB  0   Ectopic  0   Multiple  0   Live Births  1              Patient Active Problem List   Diagnosis Date Noted  . Muscle spasm 04/10/2018  . Palpitations 11/05/2013  . Elevated blood pressure 11/05/2013  . Family history of premature CAD 11/05/2013    Past Medical History:  Diagnosis Date  . Allergic rhinitis   . Anemia    04-29-19 patient having iron infusions--[facility]  . Anxiety   . Chronic headaches   . COVID-19 04/2019  . Diverticulosis   . Elevated prolactin level 09/19/2019  . Essential hypertension   . Fibroid    Ultrasound 02/12/19 - [facility] - left fundal 6.6 x 6.0 x 6.9 cm, left lateral 4.1 x 3.9 x 3.3 cm.   Marland Kitchen GERD (gastroesophageal reflux disease)   . Hx of adenomatous colonic polyps   . IBS (irritable bowel syndrome)   . Palpitations   . Seasonal allergies     Past Surgical History:  Procedure Laterality Date  . WISDOM TOOTH EXTRACTION      Current Outpatient Medications  Medication Sig Dispense  Refill  . cetirizine (ZYRTEC) 10 MG tablet Take 10 mg by mouth daily.    . Cholecalciferol (D 5000) 125 MCG (5000 UT) capsule Take 1 capsule by mouth daily.    . clonazePAM (KLONOPIN) 0.5 MG tablet Take 0.5 mg by mouth 2 (two) times daily as needed.    . cyanocobalamin (,VITAMIN B-12,) 1000 MCG/ML injection Inject into the muscle.    . cyclobenzaprine (FLEXERIL) 10 MG tablet Take 10 mg by mouth 3 (three) times daily as needed.     . gabapentin (NEURONTIN) 100 MG capsule Take 300 mg by mouth at bedtime as needed. Take 100-300 mg at bedtime as needed    . metoprolol succinate (TOPROL-XL) 25 MG 24 hr tablet Take 25 mg by mouth daily.     . metoprolol succinate (TOPROL-XL) 50 MG 24 hr tablet Take 50 mg by mouth daily.     No current facility-administered medications for this visit.     ALLERGIES: Biaxin [clarithromycin], Cymbalta [duloxetine hcl], Erythromycin, Flu virus vaccine, Hemophilus b polysaccharide vaccine, Lexapro [escitalopram oxalate], Macrobid [nitrofurantoin], Penicillins, and Sulfa antibiotics  Family History  Problem Relation Age of  Onset  . Hypertension Mother   . CAD Mother        CABG at age 23  . Colon polyps Mother 8       pre-cancerous  . Polycystic kidney disease Mother   . Diabetes Maternal Grandmother   . Stroke Maternal Grandmother   . Colon cancer Maternal Grandmother        in her 30's  . CAD Father        CABG in his late 61s  . Ovarian cancer Maternal Aunt   . Throat cancer Maternal Uncle   . Pancreatic cancer Maternal Aunt   . Scoliosis Daughter   . Healthy Daughter   . Esophageal cancer Neg Hx   . Rectal cancer Neg Hx   . Stomach cancer Neg Hx     Social History   Socioeconomic History  . Marital status: Married    Spouse name: Not on file  . Number of children: 1  . Years of education: some college  . Highest education level: Not on file  Occupational History  . Occupation: Network engineer  Tobacco Use  . Smoking status: Never Smoker  .  Smokeless tobacco: Never Used  Substance and Sexual Activity  . Alcohol use: Yes    Comment: Occasional  . Drug use: No  . Sexual activity: Not on file  Other Topics Concern  . Not on file  Social History Narrative   Lives at home with her husband.   Right-handed.   1 cup caffeine per day.   Social Determinants of Health   Financial Resource Strain:   . Difficulty of Paying Living Expenses:   Food Insecurity:   . Worried About Charity fundraiser in the Last Year:   . Arboriculturist in the Last Year:   Transportation Needs:   . Film/video editor (Medical):   Marland Kitchen Lack of Transportation (Non-Medical):   Physical Activity:   . Days of Exercise per Week:   . Minutes of Exercise per Session:   Stress:   . Feeling of Stress :   Social Connections:   . Frequency of Communication with Friends and Family:   . Frequency of Social Gatherings with Friends and Family:   . Attends Religious Services:   . Active Member of Clubs or Organizations:   . Attends Archivist Meetings:   Marland Kitchen Marital Status:   Intimate Partner Violence:   . Fear of Current or Ex-Partner:   . Emotionally Abused:   Marland Kitchen Physically Abused:   . Sexually Abused:     Review of Systems  All other systems reviewed and are negative.   PHYSICAL EXAMINATION:    BP 138/84   Pulse 60   Temp 98 F (36.7 C) (Temporal)   Ht 5\' 7"  (1.702 m)   Wt 194 lb (88 kg)   LMP 09/29/2019 (Exact Date)   BMI 30.38 kg/m     General appearance: alert, cooperative and appears stated age   Pelvic US Uterus with 2 fibroids - 66 x 64 mm and 39 x 40 mm.  Coming from left and LUS.  Echogenic mass 13 x 15 mm.  Left ovarian cyst 37 x 26 mm, thin walled, echogenic, avascular. Right ovary normal.  No adnexal masses.  No free fluid.  ASSESSMENT  Fibroids. Stable. Thickened endometrium.  Elevated prolactin.  Pelvic spasms. Hx anemia.   PLAN  US findings reviewed.  Ok to start Micronor now.  Return for  sonohysterogram.  Procedure and  rationale explained.  Check prolactin.   An After Visit Summary was printed and given to the patient.  __20____ minutes face to face time of which over 50% was spent in counseling.

## 2019-10-04 LAB — PROLACTIN: Prolactin: 14.5 ng/mL (ref 4.8–23.3)

## 2019-10-08 ENCOUNTER — Telehealth: Payer: Self-pay | Admitting: Obstetrics and Gynecology

## 2019-10-08 DIAGNOSIS — D219 Benign neoplasm of connective and other soft tissue, unspecified: Secondary | ICD-10-CM

## 2019-10-08 DIAGNOSIS — R9389 Abnormal findings on diagnostic imaging of other specified body structures: Secondary | ICD-10-CM

## 2019-10-08 NOTE — Telephone Encounter (Signed)
Call to patient. Per DPR, OK to leave message on voicemail.   Left voicemail requesting a return call to Hayley to review benefits and schedule recommended Sonohysterogram with Brook A. Silva, MD, FACOG. 

## 2019-10-10 ENCOUNTER — Encounter (HOSPITAL_COMMUNITY): Payer: Self-pay

## 2019-10-10 ENCOUNTER — Ambulatory Visit (HOSPITAL_COMMUNITY)
Admission: EM | Admit: 2019-10-10 | Discharge: 2019-10-10 | Disposition: A | Discharge status: Home / Self Care | Payer: BC Managed Care – PPO | Attending: Family Medicine | Admitting: Family Medicine

## 2019-10-10 DIAGNOSIS — M5412 Radiculopathy, cervical region: Secondary | ICD-10-CM | POA: Diagnosis not present

## 2019-10-10 MED ORDER — TIZANIDINE HCL 4 MG PO TABS: 4.0000 mg | ORAL_TABLET | Freq: Four times a day (QID) | ORAL | 0 refills | Status: DC | PRN

## 2019-10-10 MED ORDER — METHYLPREDNISOLONE 4 MG PO TBPK: ORAL_TABLET | ORAL | 0 refills | Status: DC

## 2019-10-10 MED ORDER — NORETHINDRONE ACETATE 5 MG PO TABS
5.0000 mg | ORAL_TABLET | Freq: Two times a day (BID) | ORAL | 0 refills | Status: DC
Start: 2019-10-10 — End: 2019-11-14

## 2019-10-10 NOTE — Telephone Encounter (Signed)
Spoke with patient regarding benefits for recommended Sonohysterogram. Patient acknowledges understanding of information presented. Patient is aware of cancellation policy.   Patient stated that she is currently at home changing clothes because she bled through an ultra tampon and an extra heavy pad. Patient stated that Dr. Quincy Simmonds mentioned there was another pill that she could try if the mini pill did not help with the bleeding.

## 2019-10-10 NOTE — Telephone Encounter (Signed)
OV 10/03/19 for PUS [facility] scheduled 10/24/19 H/o fibroids . Started Micronor Rx 10/03/19  Spoke with pt. Pt states having ultra tampon in over night with no bleeding through.  States then took out overnight tampon got in shower and blood ran down leg. Took shower and it slowed up. Pt then placed new ultra tampon and thick pad and took daughter to school and then to work. Pt states feeling  gush through pad, tampon and jeans. Had to come back home and change clothes. Pt states happened within 2 hours time. Pt states better now at moment. Denies lightheadedness, dizziness or blood clots. Pt just having period cramps that is manageable and not taking and OTC meds like Tylenol or Ibuprofen. Pt has been on Micronor since 10/03/19 and states usually after taking it, the bleeding slows down for 2-3 hours. This bleeding episode that occurred this morning, was the only time its happened since having OV and PUS.   Pt asking if needs different progesterone pill to help with bleeding. Advised will review with Dr Quincy Simmonds and return call to pt. Pt agreeable.   Routing to Dr Quincy Simmonds.

## 2019-10-10 NOTE — Telephone Encounter (Signed)
I recommend stopping Micronor and starting Aygestin 5 mg po bid.  Disp:  60 RF:  none.   I would like her to return for a sonohysterogram and endometrial biopsy.

## 2019-10-10 NOTE — Telephone Encounter (Signed)
Spoke back with pt. Pt given recommendations per Dr Quincy Simmonds. Pt states will start today when picks up from pharmacy. Has not taken Micronor today since usually takes at night. Pharmacy verified. Pt agreeable and verbalized understanding. Pt has scheduled [facility] on 10/24/19. Added future orders of EMB. Pt agreeable.   Routing to Dr Quincy Simmonds for review.  Rx sent to pharmacy on file. Aygestin # 60, [id number].  Cc: Rosa to precert EMB.

## 2019-10-10 NOTE — Discharge Instructions (Signed)
The Medrol Dosepak is a steroid to reduce inflammation around the nerve root Take this as directed on package, take all of day 1 today Take the muscle relaxer as needed This is useful at night Ice or heat to area Return if not improving by Sunday

## 2019-10-10 NOTE — ED Triage Notes (Signed)
Pt c/o 8/10 stabbing pain in left arm and shoulderx3 days. Pt states has numbness and tingling in arm. Pt denies injury. Pt states when she lift shoulder up she gets pain in the shoulder blade. Pt has 2+ left radial pulse, cap refill less than 3 sec, 5/5 left grip strength.

## 2019-10-10 NOTE — ED Provider Notes (Signed)
[facility]    CSN: [id number] Arrival date & time: 10/10/19  1019      History   Chief Complaint Chief Complaint  Patient presents with  . Arm Pain    HPI Monica Lawrence is a 44 y.o. female.   HPI  Patient's been having pain in her left arm and shoulder for 3 days.  She states that the pain goes from her lower neck, through the shoulder and down into her hands finger.  She states that she also has some pain in her shoulder blade.  She states she is able to use her hand.  Normal grip strength and dexterity.  She has pain with use.  She states that the pain bothers her at nighttime as well.  She said no accident or injury.  No overuse.  No prior history of neck or shoulder problems.  Past Medical History:  Diagnosis Date  . High cholesterol   . Thyroid disease     There are no problems to display for this patient.   History reviewed. No pertinent surgical history.  OB History   No obstetric history on file.      Home Medications    Prior to Admission medications   Medication Sig Start Date End Date Taking? Authorizing Provider  methylPREDNISolone (MEDROL DOSEPAK) 4 MG TBPK tablet tad 10/10/19   Eustace Moore, MD  tiZANidine (ZANAFLEX) 4 MG tablet Take 1-2 tablets (4-8 mg total) by mouth every 6 (six) hours as needed for muscle spasms. 10/10/19   Eustace Moore, MD  atorvastatin (LIPITOR) 10 MG tablet TK 1 T PO HS 07/22/17 10/10/19  [provider]  fluticasone (FLONASE) 50 MCG/ACT nasal spray 1 spray.  10/10/19  [provider]    Family History Family History  Problem Relation Age of Onset  . Healthy Mother   . Healthy Father     Social History Social History   Tobacco Use  . Smoking status: Never Smoker  . Smokeless tobacco: Never Used  Substance Use Topics  . Alcohol use: No  . Drug use: No     Allergies   Ambien [zolpidem tartrate] and Neomycin   Review of Systems Review of Systems  Musculoskeletal: Positive  for neck pain and neck stiffness.  Neurological: Positive for numbness.     Physical Exam Triage Vital Signs ED Triage Vitals  Enc Vitals Group     BP 10/10/19 1038 (!) 100/58     Pulse Rate 10/10/19 1038 80     Resp 10/10/19 1038 16     Temp 10/10/19 1038 98.1 F (36.7 C)     Temp Source 10/10/19 1038 Oral     SpO2 10/10/19 1038 100 %     Weight 10/10/19 1042 170 lb (77.1 kg)     Height 10/10/19 1042 5' (1.524 m)     Head Circumference --      Peak Flow --      Pain Score 10/10/19 1042 8     Pain Loc --      Pain Edu? --      Excl. in GC? --    No data found.  Updated Vital Signs BP (!) 100/58   Pulse 80   Temp 98.1 F (36.7 C) (Oral)   Resp 16   Ht 5' (1.524 m)   Wt 77.1 kg   SpO2 100%   BMI 33.20 kg/m     Physical Exam Constitutional:      General: She is  in acute distress.     Appearance: She is well-developed.     Comments: Appears uncomfortable  HENT:     Head: Normocephalic and atraumatic.     Mouth/Throat:     Comments: Mask is in place Eyes:     Conjunctiva/sclera: Conjunctivae normal.     Pupils: Pupils are equal, round, and reactive to light.  Neck:     Comments: Tenderness in the left upper body at the trapezius, and in the left paraspinous muscles.  Tenderness to deep palpation at the left C6-7 region.  Strength sensation range of motion reflexes normal in both upper extremities Cardiovascular:     Rate and Rhythm: Normal rate.  Pulmonary:     Effort: Pulmonary effort is normal. No respiratory distress.  Abdominal:     General: There is no distension.     Palpations: Abdomen is soft.  Musculoskeletal:        General: Normal range of motion.     Cervical back: Normal range of motion. Tenderness present.  Skin:    General: Skin is warm and dry.  Neurological:     Mental Status: She is alert.     Sensory: No sensory deficit.     Motor: No weakness.     Deep Tendon Reflexes: Reflexes normal.  Psychiatric:        Mood and Affect: Mood  normal.        Behavior: Behavior normal.      UC Treatments / Results  Labs (all labs ordered are listed, but only abnormal results are displayed) Labs Reviewed - No data to display  EKG   Radiology No results found.  Procedures Procedures (including critical care time)  Medications Ordered in UC Medications - No data to display  Initial Impression / Assessment and Plan / UC Course  I have reviewed the triage vital signs and the nursing notes.  Pertinent labs & imaging results that were available during my care of the patient were reviewed by me and considered in my medical decision making (see chart for details).      Final Clinical Impressions(s) / UC Diagnoses   Final diagnoses:  Cervical radiculitis     Discharge Instructions     The Medrol Dosepak is a steroid to reduce inflammation around the nerve root Take this as directed on package, take all of day 1 today Take the muscle relaxer as needed This is useful at night Ice or heat to area Return if not improving by Sunday   ED Prescriptions    Medication Sig Dispense Auth. Provider   methylPREDNISolone (MEDROL DOSEPAK) 4 MG TBPK tablet tad 21 tablet Raylene Everts, MD   tiZANidine (ZANAFLEX) 4 MG tablet Take 1-2 tablets (4-8 mg total) by mouth every 6 (six) hours as needed for muscle spasms. 21 tablet Raylene Everts, MD     PDMP not reviewed this encounter.   Raylene Everts, MD 10/10/19 [phone]

## 2019-10-24 ENCOUNTER — Encounter (HOSPITAL_COMMUNITY): Payer: Self-pay | Admitting: Emergency Medicine

## 2019-10-24 ENCOUNTER — Other Ambulatory Visit: Payer: Self-pay

## 2019-10-24 ENCOUNTER — Ambulatory Visit (HOSPITAL_COMMUNITY)
Admission: EM | Admit: 2019-10-24 | Discharge: 2019-10-24 | Disposition: A | Discharge status: Home / Self Care | Payer: BC Managed Care – PPO | Attending: Internal Medicine | Admitting: Internal Medicine

## 2019-10-24 ENCOUNTER — Other Ambulatory Visit (HOSPITAL_COMMUNITY)
Admission: RE | Admit: 2019-10-24 | Discharge: 2019-10-24 | Disposition: A | Discharge status: Home / Self Care | Payer: BC Managed Care – PPO | Source: Ambulatory Visit | Attending: Obstetrics and Gynecology | Admitting: Obstetrics and Gynecology

## 2019-10-24 ENCOUNTER — Encounter: Payer: Self-pay | Admitting: Obstetrics and Gynecology

## 2019-10-24 ENCOUNTER — Ambulatory Visit: Payer: BC Managed Care – PPO | Admitting: Obstetrics and Gynecology

## 2019-10-24 ENCOUNTER — Ambulatory Visit (INDEPENDENT_AMBULATORY_CARE_PROVIDER_SITE_OTHER): Payer: BC Managed Care – PPO

## 2019-10-24 VITALS — BP 118/64 | HR 60 | Temp 97.6°F | Ht 67.0 in | Wt 194.0 lb

## 2019-10-24 DIAGNOSIS — E7800 Pure hypercholesterolemia, unspecified: Secondary | ICD-10-CM | POA: Diagnosis not present

## 2019-10-24 DIAGNOSIS — E079 Disorder of thyroid, unspecified: Secondary | ICD-10-CM | POA: Diagnosis not present

## 2019-10-24 DIAGNOSIS — J069 Acute upper respiratory infection, unspecified: Secondary | ICD-10-CM | POA: Diagnosis not present

## 2019-10-24 DIAGNOSIS — R05 Cough: Secondary | ICD-10-CM | POA: Insufficient documentation

## 2019-10-24 DIAGNOSIS — Z20822 Contact with and (suspected) exposure to covid-19: Secondary | ICD-10-CM | POA: Diagnosis not present

## 2019-10-24 DIAGNOSIS — R0982 Postnasal drip: Secondary | ICD-10-CM | POA: Insufficient documentation

## 2019-10-24 DIAGNOSIS — Z79899 Other long term (current) drug therapy: Secondary | ICD-10-CM | POA: Insufficient documentation

## 2019-10-24 DIAGNOSIS — N393 Stress incontinence (female) (male): Secondary | ICD-10-CM

## 2019-10-24 DIAGNOSIS — R9389 Abnormal findings on diagnostic imaging of other specified body structures: Secondary | ICD-10-CM

## 2019-10-24 DIAGNOSIS — D219 Benign neoplasm of connective and other soft tissue, unspecified: Secondary | ICD-10-CM

## 2019-10-24 DIAGNOSIS — N9489 Other specified conditions associated with female genital organs and menstrual cycle: Secondary | ICD-10-CM

## 2019-10-24 MED ORDER — GUAIFENESIN ER 600 MG PO TB12: 600.0000 mg | ORAL_TABLET | Freq: Two times a day (BID) | ORAL | 0 refills | Status: AC

## 2019-10-24 MED ORDER — LORATADINE 10 MG PO TABS: 10.0000 mg | ORAL_TABLET | Freq: Every day | ORAL | 2 refills | Status: AC

## 2019-10-24 MED ORDER — PREDNISONE 20 MG PO TABS: 20.0000 mg | ORAL_TABLET | Freq: Every day | ORAL | 0 refills | Status: AC

## 2019-10-24 MED ORDER — BENZONATATE 100 MG PO CAPS: 100.0000 mg | ORAL_CAPSULE | Freq: Three times a day (TID) | ORAL | 0 refills | Status: AC

## 2019-10-24 NOTE — Progress Notes (Signed)
GYNECOLOGY  VISIT   HPI: 44 y.o.   Married  Caucasian  female   G1P1001 with Patient's last menstrual period was 09/29/2019 (exact date).   here for pelvic ultrasound.     Patient is taking Aygestin and having ongoing vaginal bleeding.  Her bleeding started on 09/29/19 and has been ongoing and heavy at times.   She has known fibroids and a suggested endometrial mass on her recent prior pelvic US.  Her FSH and estradiol are premenopausal.  Can leak with a cough or laugh.   GYNECOLOGIC HISTORY: Patient's last menstrual period was 09/29/2019 (exact date). Contraception: None Menopausal hormone therapy:  n/a Last mammogram: 12/02/16 BIRADS 1 negative/density b  Last pap smear: 09-10-14 Neg:Neg HR HPV        OB History    Gravida  1   Para  1   Term  1   Preterm  0   AB  0   Living  1     SAB  0   TAB  0   Ectopic  0   Multiple  0   Live Births  1              Patient Active Problem List   Diagnosis Date Noted  . Muscle spasm 04/10/2018  . Palpitations 11/05/2013  . Elevated blood pressure 11/05/2013  . Family history of premature CAD 11/05/2013    Past Medical History:  Diagnosis Date  . Allergic rhinitis   . Anemia    04-29-19 patient having iron infusions--[facility]  . Anxiety   . Chronic headaches   . COVID-19 04/2019  . Diverticulosis   . Elevated prolactin level 09/19/2019  . Essential hypertension   . Fibroid    Ultrasound 02/12/19 - [place] - left fundal 6.6 x 6.0 x 6.9 cm, left lateral 4.1 x 3.9 x 3.3 cm.   Marland Kitchen GERD (gastroesophageal reflux disease)   . Hx of adenomatous colonic polyps   . IBS (irritable bowel syndrome)   . Palpitations   . Seasonal allergies     Past Surgical History:  Procedure Laterality Date  . WISDOM TOOTH EXTRACTION      Current Outpatient Medications  Medication Sig Dispense Refill  . cetirizine (ZYRTEC) 10 MG tablet Take 10 mg by mouth daily.    . Cholecalciferol (D 5000) 125 MCG (5000 UT)  capsule Take 1 capsule by mouth daily.    . clonazePAM (KLONOPIN) 0.5 MG tablet Take 0.5 mg by mouth 2 (two) times daily as needed.    . cyanocobalamin (,VITAMIN B-12,) 1000 MCG/ML injection Inject into the muscle.    . cyclobenzaprine (FLEXERIL) 10 MG tablet Take 10 mg by mouth 3 (three) times daily as needed.     . gabapentin (NEURONTIN) 100 MG capsule Take 300 mg by mouth at bedtime as needed. Take 100-300 mg at bedtime as needed    . metoprolol succinate (TOPROL-XL) 25 MG 24 hr tablet Take 25 mg by mouth daily.     . metoprolol succinate (TOPROL-XL) 50 MG 24 hr tablet Take 50 mg by mouth daily.    . norethindrone (AYGESTIN) 5 MG tablet Take 1 tablet (5 mg total) by mouth 2 (two) times daily. 60 tablet 0   No current facility-administered medications for this visit.     ALLERGIES: Biaxin [clarithromycin], Cymbalta [duloxetine hcl], Erythromycin, Flu virus vaccine, Hemophilus b polysaccharide vaccine, Lexapro [escitalopram oxalate], Macrobid [nitrofurantoin], Penicillins, and Sulfa antibiotics  Family History  Problem Relation Age of Onset  .  Hypertension Mother   . CAD Mother        CABG at age 79  . Colon polyps Mother 75       pre-cancerous  . Polycystic kidney disease Mother   . Diabetes Maternal Grandmother   . Stroke Maternal Grandmother   . Colon cancer Maternal Grandmother        in her 73's  . CAD Father        CABG in his late 90s  . Ovarian cancer Maternal Aunt   . Throat cancer Maternal Uncle   . Pancreatic cancer Maternal Aunt   . Scoliosis Daughter   . Healthy Daughter   . Esophageal cancer Neg Hx   . Rectal cancer Neg Hx   . Stomach cancer Neg Hx     Social History   Socioeconomic History  . Marital status: Married    Spouse name: Not on file  . Number of children: 1  . Years of education: some college  . Highest education level: Not on file  Occupational History  . Occupation: Network engineer  Tobacco Use  . Smoking status: Never Smoker  . Smokeless  tobacco: Never Used  Substance and Sexual Activity  . Alcohol use: Yes    Comment: Occasional  . Drug use: No  . Sexual activity: Not on file  Other Topics Concern  . Not on file  Social History Narrative   Lives at home with her husband.   Right-handed.   1 cup caffeine per day.   Social Determinants of Health   Financial Resource Strain:   . Difficulty of Paying Living Expenses:   Food Insecurity:   . Worried About Charity fundraiser in the Last Year:   . Arboriculturist in the Last Year:   Transportation Needs:   . Film/video editor (Medical):   Marland Kitchen Lack of Transportation (Non-Medical):   Physical Activity:   . Days of Exercise per Week:   . Minutes of Exercise per Session:   Stress:   . Feeling of Stress :   Social Connections:   . Frequency of Communication with Friends and Family:   . Frequency of Social Gatherings with Friends and Family:   . Attends Religious Services:   . Active Member of Clubs or Organizations:   . Attends Archivist Meetings:   Marland Kitchen Marital Status:   Intimate Partner Violence:   . Fear of Current or Ex-Partner:   . Emotionally Abused:   Marland Kitchen Physically Abused:   . Sexually Abused:     Review of Systems  All other systems reviewed and are negative.   PHYSICAL EXAMINATION:    BP 118/64 (Cuff Size: Large)   Pulse 60   Temp 97.6 F (36.4 C) (Temporal)   Ht 5\' 7"  (1.702 m)   Wt 194 lb (88 kg)   LMP 09/29/2019 (Exact Date)   BMI 30.38 kg/m     General appearance: alert, cooperative and appears stated age  Pelvic US/sonohysterogram/EMB Consent done for procedures.  Pelvic US  Fibroids noted.  EMS 15.69 mm. Ovaries normal without cyst formation.  No free fluid.   Sonohysterogram Sterile prep with Hibiclens. Tenaculum to anterior cervical lip.  Cannula placed inside uterine cavity and sterile NS injected.  Filling defect noted - 15 mm and linear.  EMB Reprep of cervix.  Tenaculum to anterior cervical lip.  Pipelle  passed to 9 cm x 3.  Tissue to pathology.  No complications.  Minimal EBL.  Chaperone was  present for exam.  ASSESSMENT  Endometrial mass and thickening. Fibroids. Mild stress incontinence.   PLAN  In addition to the US findings and procedures performed, we had discussion about care and urinary stress incontinence.  Fu EMB.  Precautions given. Information about hysteroscopy with Myosure resection of polyp and D & C versus laparoscopic hysterectomy.  Verbal discussion and [facility].  Ok to continue Aygestin.  Effect of large fibroids on urinary incontinence reviewed. We discussed potential midurethral sling if she chooses hysterectomy.    An After Visit Summary was printed and given to the patient.  __25____ minutes face to face time of which over 50% was spent in counseling.

## 2019-10-24 NOTE — Patient Instructions (Signed)

## 2019-10-24 NOTE — ED Triage Notes (Signed)
Pt c/o productive cough with phlegm x 5 days ago. She states when she coughs she gets a pain in her left arm. Patient states she has been taking sudafed and allegra with no relief. She also took robitussin. Cough gets worse with talking.

## 2019-10-25 LAB — SARS CORONAVIRUS 2 (TAT 6-24 HRS): SARS Coronavirus 2: NEGATIVE

## 2019-10-25 NOTE — ED Provider Notes (Signed)
[facility]    CSN: [id number] Arrival date & time: 10/24/19  1817      History   Chief Complaint Chief Complaint  Patient presents with  . Cough    HPI RAEANA BLINN is a 44 y.o. female comes to urgent care with cough productive of clear to yellowish 5 days duration.  Symptoms started with some nasal congestion, postnasal drainage along with a cough.  Patient denies any shortness of breath or wheezing.  She has a history of seasonal allergies but does not take any antihistamines.  No fever or chills.  No generalized body aches.  Patient has completed vaccination against COVID-19 virus.   No sick contacts.  HPI  Past Medical History:  Diagnosis Date  . High cholesterol   . Thyroid disease     There are no problems to display for this patient.   History reviewed. No pertinent surgical history.  OB History   No obstetric history on file.      Home Medications    Prior to Admission medications   Medication Sig Start Date End Date Taking? Authorizing Provider  benzonatate (TESSALON) 100 MG capsule Take 1 capsule (100 mg total) by mouth every 8 (eight) hours. 10/24/19   Jerie Basford, Britta Mccreedy, MD  guaiFENesin (MUCINEX) 600 MG 12 hr tablet Take 1 tablet (600 mg total) by mouth 2 (two) times daily for 14 days. 10/24/19 11/07/19  Merrilee Jansky, MD  loratadine (CLARITIN) 10 MG tablet Take 1 tablet (10 mg total) by mouth daily. 10/24/19   Shamela Haydon, Britta Mccreedy, MD  predniSONE (DELTASONE) 20 MG tablet Take 1 tablet (20 mg total) by mouth daily for 5 days. 10/24/19 10/29/19  Merrilee Jansky, MD  atorvastatin (LIPITOR) 10 MG tablet TK 1 T PO HS 07/22/17 10/10/19  [provider]  fluticasone (FLONASE) 50 MCG/ACT nasal spray 1 spray.  10/10/19  [provider]    Family History Family History  Problem Relation Age of Onset  . Healthy Mother   . Healthy Father     Social History Social History   Tobacco Use  . Smoking status: Never Smoker  . Smokeless  tobacco: Never Used  Substance Use Topics  . Alcohol use: No  . Drug use: No     Allergies   Ambien [zolpidem tartrate] and Neomycin   Review of Systems Review of Systems  Constitutional: Negative.   HENT: Positive for congestion. Negative for sinus pressure, sinus pain and sore throat.   Respiratory: Positive for cough. Negative for shortness of breath and wheezing.   Cardiovascular: Negative for chest pain and palpitations.  Gastrointestinal: Negative.   Musculoskeletal: Negative.   Skin: Negative.   Neurological: Negative.  Negative for dizziness, light-headedness and headaches.     Physical Exam Triage Vital Signs ED Triage Vitals  Enc Vitals Group     BP 10/24/19 1841 (!) 110/55     Pulse Rate 10/24/19 1841 77     Resp 10/24/19 1841 14     Temp 10/24/19 1841 98.9 F (37.2 C)     Temp Source 10/24/19 1841 Oral     SpO2 10/24/19 1841 99 %     Weight --      Height --      Head Circumference --      Peak Flow --      Pain Score 10/24/19 1927 0     Pain Loc --      Pain Edu? --  Excl. in GC? --    No data found.  Updated Vital Signs BP (!) 110/55 (BP Location: Left Arm)   Pulse 77   Temp 98.9 F (37.2 C) (Oral)   Resp 14   LMP 09/23/2019   SpO2 99%   Visual Acuity Right Eye Distance:   Left Eye Distance:   Bilateral Distance:    Right Eye Near:   Left Eye Near:    Bilateral Near:     Physical Exam   UC Treatments / Results  Labs (all labs ordered are listed, but only abnormal results are displayed) Labs Reviewed  SARS CORONAVIRUS 2 (TAT 6-24 HRS)    EKG   Radiology No results found.  Procedures Procedures (including critical care time)  Medications Ordered in UC Medications - No data to display  Initial Impression / Assessment and Plan / UC Course  I have reviewed the triage vital signs and the nursing notes.  Pertinent labs & imaging results that were available during my care of the patient were reviewed by me and  considered in my medical decision making (see chart for details).     1.  Viral URI: COVID-19 PCR sent Patient is advised to quarantine until COVID-19 test results are available. Tessalon Perles as needed for cough Mucinex 600 mg twice daily Claritin 10 mg orally daily Return precautions given If patient symptoms worsens she is advised to return to urgent care to be reevaluated. Final Clinical Impressions(s) / UC Diagnoses   Final diagnoses:  Viral URI with cough   Discharge Instructions   None    ED Prescriptions    Medication Sig Dispense Auth. Provider   benzonatate (TESSALON) 100 MG capsule Take 1 capsule (100 mg total) by mouth every 8 (eight) hours. 21 capsule Marli Diego, Myrene Galas, MD   predniSONE (DELTASONE) 20 MG tablet Take 1 tablet (20 mg total) by mouth daily for 5 days. 5 tablet Jamiere Gulas, Myrene Galas, MD   guaiFENesin (MUCINEX) 600 MG 12 hr tablet Take 1 tablet (600 mg total) by mouth 2 (two) times daily for 14 days. 28 tablet Jasime Westergren, Myrene Galas, MD   loratadine (CLARITIN) 10 MG tablet Take 1 tablet (10 mg total) by mouth daily. 30 tablet Yancy Hascall, Myrene Galas, MD     PDMP not reviewed this encounter.   Chase Picket, MD 10/25/19 1001

## 2019-10-28 LAB — SURGICAL PATHOLOGY

## 2019-10-30 ENCOUNTER — Telehealth: Payer: Self-pay | Admitting: *Deleted

## 2019-10-30 NOTE — Telephone Encounter (Signed)
Message left to return call to Triage Nurse at [phone].    

## 2019-10-30 NOTE — Telephone Encounter (Signed)
-----   Message from Nunzio Cobbs, MD sent at 10/30/2019  6:27 AM EDT ----- Please inform patient of endometrial biopsy showing benign endometrial polyp.  No abnormal cells were seen.  We discussed hysteroscopy versus hysterectomy because she also has fibroids.  She was discussed a midurethral sling if she chooses hysterectomy, and this would need workup with urodynamics first.  She may need an appointment to discuss this further.

## 2019-11-01 ENCOUNTER — Other Ambulatory Visit: Payer: Self-pay | Admitting: Obstetrics and Gynecology

## 2019-11-01 NOTE — Telephone Encounter (Signed)
Patient returned call

## 2019-11-01 NOTE — Telephone Encounter (Signed)
Spoke with pt. Pt given results and recommendations per Dr Quincy Simmonds.  Pt verbalized understanding. Pt unsure of which way to proceed either with hysteroscopy or hysterectomy at this time. Pt scheduled an OV to further discuss on 11/14/19 at 1030 am. Pt agreeable to date and time.   Routing to Dr Quincy Simmonds for review Encounter closed.

## 2019-11-12 ENCOUNTER — Ambulatory Visit: Payer: BC Managed Care – PPO | Admitting: Neurology

## 2019-11-13 NOTE — Progress Notes (Signed)
GYNECOLOGY  VISIT   HPI: 44 y.o.   Married  Caucasian  female   G1P1001 with Patient's last menstrual period was 09/29/2019 (exact date).   here to discuss surgery options.   Patient is perimenopausal by [facility] and estradiol and she has abnormal bleeding  She has known fibroids and an endometrial mass.  Her fibroids are 39 x 40 mm and 66 x 64 mm.  Sonohysterogram showed a 15 mm linear filling defect and EMB showed a benign polyp.   She is taking Aygestin 5 mg bid to control her bleeding.  Her bleeding has slowed but not stopped. She is using panty liner or regular tampon now.  She changes every 3 - 4 hours.  She currently denies pain.   She has a history of low back pain and bladder spasms, and this is not as much of an issue lately with her back.  She can have maybe one day where she is having some irritative feeling with voiding.  This is not consistent.   She also has anemia and low ferritin.  She states she feels like she is anemic again.  Ferritin 9 and hemoglobin 13.5 on 09/23/19.  She has an appointment to see her hematologist on 12/02/19.  Some leakage with a cough or laugh.   She is not planning on future childbearing.   She is going on vacation 12/21/19 - 12/28/19.  GYNECOLOGIC HISTORY: Patient's last menstrual period was 09/29/2019 (exact date). Contraception: None Menopausal hormone therapy: ?Agyestin Last mammogram: 12/02/16 BIRADS 1 negative/density b.  She will schedule.  Last pap smear: 09-10-14 Neg:Neg HR HPV        OB History    Gravida  1   Para  1   Term  1   Preterm  0   AB  0   Living  1     SAB  0   TAB  0   Ectopic  0   Multiple  0   Live Births  1              Patient Active Problem List   Diagnosis Date Noted  . Muscle spasm 04/10/2018  . Palpitations 11/05/2013  . Elevated blood pressure 11/05/2013  . Family history of premature CAD 11/05/2013    Past Medical History:  Diagnosis Date  . Allergic rhinitis   . Anemia     04-29-19 patient having iron infusions--[facility]  . Anxiety   . Chronic headaches   . COVID-19 04/2019  . Diverticulosis   . Elevated prolactin level 09/19/2019  . Essential hypertension   . Fibroid    Ultrasound 02/12/19 - [place] - left fundal 6.6 x 6.0 x 6.9 cm, left lateral 4.1 x 3.9 x 3.3 cm.   Marland Kitchen GERD (gastroesophageal reflux disease)   . Hx of adenomatous colonic polyps   . IBS (irritable bowel syndrome)   . Palpitations   . Seasonal allergies     Past Surgical History:  Procedure Laterality Date  . WISDOM TOOTH EXTRACTION      Current Outpatient Medications  Medication Sig Dispense Refill  . cetirizine (ZYRTEC) 10 MG tablet Take 10 mg by mouth daily.    . Cholecalciferol (D 5000) 125 MCG (5000 UT) capsule Take 1 capsule by mouth daily.    . clonazePAM (KLONOPIN) 0.5 MG tablet Take 0.5 mg by mouth 2 (two) times daily as needed.    . cyanocobalamin (,VITAMIN B-12,) 1000 MCG/ML injection Inject into the muscle.    Marland Kitchen  cyclobenzaprine (FLEXERIL) 10 MG tablet Take 10 mg by mouth 3 (three) times daily as needed.     . gabapentin (NEURONTIN) 100 MG capsule Take 300 mg by mouth at bedtime as needed. Take 100-300 mg at bedtime as needed    . metoprolol succinate (TOPROL-XL) 50 MG 24 hr tablet Take 50 mg by mouth daily.    . norethindrone (AYGESTIN) 5 MG tablet Take 1 tablet (5 mg total) by mouth 2 (two) times daily. 60 tablet 0  . metoprolol succinate (TOPROL-XL) 25 MG 24 hr tablet Take 25 mg by mouth daily.  (Patient not taking: Reported on 11/14/2019)     No current facility-administered medications for this visit.     ALLERGIES: Biaxin [clarithromycin], Cymbalta [duloxetine hcl], Erythromycin, Flu virus vaccine, Hemophilus b polysaccharide vaccine, Lexapro [escitalopram oxalate], Macrobid [nitrofurantoin], Penicillins, and Sulfa antibiotics  Family History  Problem Relation Age of Onset  . Hypertension Mother   . CAD Mother        CABG at age 31  . Colon polyps  Mother 22       pre-cancerous  . Polycystic kidney disease Mother   . Diabetes Maternal Grandmother   . Stroke Maternal Grandmother   . Colon cancer Maternal Grandmother        in her 48's  . CAD Father        CABG in his late 35s  . Ovarian cancer Maternal Aunt   . Throat cancer Maternal Uncle   . Pancreatic cancer Maternal Aunt   . Scoliosis Daughter   . Healthy Daughter   . Esophageal cancer Neg Hx   . Rectal cancer Neg Hx   . Stomach cancer Neg Hx     Social History   Socioeconomic History  . Marital status: Married    Spouse name: Not on file  . Number of children: 1  . Years of education: some college  . Highest education level: Not on file  Occupational History  . Occupation: Network engineer  Tobacco Use  . Smoking status: Never Smoker  . Smokeless tobacco: Never Used  Vaping Use  . Vaping Use: Never used  Substance and Sexual Activity  . Alcohol use: Yes    Comment: Occasional  . Drug use: No  . Sexual activity: Not on file  Other Topics Concern  . Not on file  Social History Narrative   Lives at home with her husband.   Right-handed.   1 cup caffeine per day.   Social Determinants of Health   Financial Resource Strain:   . Difficulty of Paying Living Expenses:   Food Insecurity:   . Worried About Charity fundraiser in the Last Year:   . Arboriculturist in the Last Year:   Transportation Needs:   . Film/video editor (Medical):   Marland Kitchen Lack of Transportation (Non-Medical):   Physical Activity:   . Days of Exercise per Week:   . Minutes of Exercise per Session:   Stress:   . Feeling of Stress :   Social Connections:   . Frequency of Communication with Friends and Family:   . Frequency of Social Gatherings with Friends and Family:   . Attends Religious Services:   . Active Member of Clubs or Organizations:   . Attends Archivist Meetings:   Marland Kitchen Marital Status:   Intimate Partner Violence:   . Fear of Current or Ex-Partner:   . Emotionally  Abused:   Marland Kitchen Physically Abused:   . Sexually Abused:  Review of Systems  All other systems reviewed and are negative.   PHYSICAL EXAMINATION:    BP 124/78 (Cuff Size: Large)   Pulse 76   Temp (!) 97.2 F (36.2 C) (Temporal)   Ht 5\' 7"  (1.702 m)   Wt 188 lb (85.3 kg)   LMP 09/29/2019 (Exact Date)   BMI 29.44 kg/m     General appearance: alert, cooperative and appears stated age   ASSESSMENT  Fibroids.  Endometrial polyp. Low ferritin.  Stress incontinence   PLAN  We discussed hysteroscopy with Myosure resection of endometrial polyp versus total laparoscopic hysterectomy with bilateral salpingectomy.  Risks and benefits of each reviewed.  Risks include but are not limited to bleeding, infection, damage to surrounding organs including uterine perforation requiring hospitalization and laparoscopy, pulmonary edema, reaction to anesthesia, DVT, PE, death, need for reoperation and further treatment, and possible continued bleeding and anemia if hysteroscopy is chosen. Surgical expectations and recovery discussed.  Patient wishes to proceed with hysteroscopic removal of the endometrial polyp, dilation and curettage.  Refill Aygestin.   She will schedule her mammogram.   She will move up her appt with heme/onc to check her CBC, ferritin.

## 2019-11-14 ENCOUNTER — Encounter: Payer: Self-pay | Admitting: Obstetrics and Gynecology

## 2019-11-14 ENCOUNTER — Other Ambulatory Visit: Payer: Self-pay

## 2019-11-14 ENCOUNTER — Ambulatory Visit: Payer: BC Managed Care – PPO | Admitting: Obstetrics and Gynecology

## 2019-11-14 VITALS — BP 124/78 | HR 76 | Temp 97.2°F | Ht 67.0 in | Wt 188.0 lb

## 2019-11-14 DIAGNOSIS — D219 Benign neoplasm of connective and other soft tissue, unspecified: Secondary | ICD-10-CM | POA: Diagnosis not present

## 2019-11-14 DIAGNOSIS — N840 Polyp of corpus uteri: Secondary | ICD-10-CM

## 2019-11-14 MED ORDER — NORETHINDRONE ACETATE 5 MG PO TABS: 5.0000 mg | ORAL_TABLET | Freq: Two times a day (BID) | ORAL | 1 refills | Status: DC

## 2019-11-15 ENCOUNTER — Telehealth: Payer: Self-pay | Admitting: Obstetrics and Gynecology

## 2019-11-15 NOTE — Telephone Encounter (Signed)
Please precert and schedule surgery.   Patient needs a hysteroscopy with Myosure removal of endometrial polyp, dilation and curettage.   She has menorrhagia with irregular menses, fibroids, and an endometrial polyp.  She is currently on Aygestin to control her bleeding.   She will see her hematologist for a recheck of her anemia prior to surgery.

## 2019-11-18 NOTE — Telephone Encounter (Signed)
Call to patient. Per DPR, OK to leave message on voicemail.   Left voicemail requesting a return call to [facility] to review benefits for recommended surgery with Brook A. Quincy Simmonds, MD, Cherlynn June

## 2019-11-27 NOTE — Telephone Encounter (Signed)
Call to patient. Per DPR, OK to leave message on voicemail.  Left voicemail requesting a return call to [facility] to review benefits for recommended surgery with Brook A. Quincy Simmonds, MD, Cherlynn June

## 2019-12-03 ENCOUNTER — Other Ambulatory Visit: Payer: Self-pay | Admitting: Obstetrics and Gynecology

## 2019-12-03 DIAGNOSIS — Z1231 Encounter for screening mammogram for malignant neoplasm of breast: Secondary | ICD-10-CM

## 2019-12-04 ENCOUNTER — Other Ambulatory Visit: Payer: Self-pay

## 2019-12-04 NOTE — Telephone Encounter (Signed)
Patient is calling to check status of refill. 

## 2019-12-04 NOTE — Telephone Encounter (Signed)
Patient is calling in regards to medication refill being denied for Norethindrone.

## 2019-12-05 MED ORDER — NORETHINDRONE ACETATE 5 MG PO TABS: 5.0000 mg | ORAL_TABLET | Freq: Two times a day (BID) | ORAL | 0 refills | Status: DC

## 2019-12-05 NOTE — Telephone Encounter (Signed)
Detailed message left per DPR advising patient refill request for Aygestin had been sent to Dr. Quincy Simmonds for approval.

## 2019-12-05 NOTE — Telephone Encounter (Signed)
Medication refill request: Aygestin Last AEX:  04-29-2019 BS Next AEX: 04-30-20  Last MMG (if hormonal medication request): 12-02-16 density B/BIRADS 1 negative, scheduled for 01-02-20 Refill authorized: Today, please advise.   Medication pended for #60, 0RF. Please refill if appropriate.

## 2019-12-05 NOTE — Telephone Encounter (Signed)
Please contact patient to see where we are with her surgical planning.  She needs a hysteroscopic removal of her endometrial polyp.  I will refill Aygestin for one more month, but this is not definitive treatment for the polyp.  I think I recall she may have some travel plans this summer.

## 2019-12-09 NOTE — Telephone Encounter (Signed)
Left message to call Dawid Dupriest, RN at [facility] [phone].   

## 2019-12-12 NOTE — Telephone Encounter (Signed)
Dr.Silva, we have been unable to reach this patient regarding proceeding with surgery. Please advise on next steps.

## 2019-12-12 NOTE — Telephone Encounter (Signed)
Please send her a letter to call the office regarding surgery planning.   I believe she is traveling from 12/21/19 to 8/7//21.

## 2019-12-17 NOTE — Telephone Encounter (Signed)
Letter mailed to patient. Encounter closed.

## 2019-12-18 NOTE — Telephone Encounter (Signed)
Left message for pt to return call to triage RN. 

## 2019-12-20 ENCOUNTER — Ambulatory Visit: Payer: BC Managed Care – PPO | Admitting: Neurology

## 2019-12-27 NOTE — Telephone Encounter (Signed)
Left messages on pt's mobile and home numbers to return call.

## 2019-12-30 ENCOUNTER — Telehealth: Payer: Self-pay | Admitting: Obstetrics and Gynecology

## 2019-12-30 ENCOUNTER — Telehealth: Payer: Self-pay

## 2019-12-30 MED ORDER — NORETHINDRONE ACETATE 5 MG PO TABS: 5.0000 mg | ORAL_TABLET | Freq: Two times a day (BID) | ORAL | 0 refills | Status: DC

## 2019-12-30 NOTE — Telephone Encounter (Signed)
Spoke with patient. Hysteroscopy D&C Myosure removal of endometrial polyp scheduled for 02/03/2020 at 8:45 am at [facility] with Dr.Silva. Pre op scheduled for 01/23/2020 at 4:30 pm with Dr.Silva. COVID test scheduled for 01/30/2020 at 3:10 pm at [facility] location. Patient is aware of the need to quarantine after test until surgery. 2 week post op scheduled for 02/18/2020 at 4:30 pm with Dr.Silva. Patient is agreeable to date and time. Surgery instructions reviewed with patient and to be given at patient's pre op appointment. Patient verbalizes understanding.  Routing to provider and will close encounter.

## 2019-12-30 NOTE — Telephone Encounter (Signed)
Spoke with patient regarding surgery benefits. Patient acknowledges understanding of information presented. Patient is aware that benefits presented are for professional benefits only. Patient is aware that once surgery is scheduled, the hospital will call with separate benefits. Patient is aware of surgery cancellation policy.

## 2019-12-30 NOTE — Telephone Encounter (Signed)
Patient is returning call.  °

## 2019-12-30 NOTE — Telephone Encounter (Signed)
Left message to call [place] at [phone].  Need to reschedule patient's pre op appointment on 01/23/2020 with Dr.Silva due to CPR class.

## 2019-12-30 NOTE — Telephone Encounter (Signed)
Pt returned call. Spoke with pt. Pt states returned from vacation this weekend and calling to schedule procedure with Dr Quincy Simmonds.  Pt requesting Sept due to starting back to work.  Kaitlyn, RN-surgery planning,  has placed pt for procedure on 02/03/20. Pt agreeable. Pt to discuss benefits with Hayley now.   Pt states has been doing well on Aygestin 10 mg daily. Pt states only having to wear a panty liner every day and only having light brown spotting.  Pt states only having a few pills left of Rx.  Pt advised will review with Dr Quincy Simmonds for refill request of Aygestin. Pt agreeable. Pharmacy verified.   Rx Aygestin # 70, 0RF pended if approved.   Routing to Dr Quincy Simmonds for update and Rx refill.

## 2020-01-02 ENCOUNTER — Ambulatory Visit
Admission: RE | Admit: 2020-01-02 | Discharge: 2020-01-02 | Disposition: A | Discharge status: Home / Self Care | Payer: BC Managed Care – PPO | Source: Ambulatory Visit

## 2020-01-02 ENCOUNTER — Other Ambulatory Visit: Payer: Self-pay

## 2020-01-02 DIAGNOSIS — Z1231 Encounter for screening mammogram for malignant neoplasm of breast: Secondary | ICD-10-CM

## 2020-01-08 NOTE — Telephone Encounter (Signed)
Spoke with patient. Patient's pre op moved to 01/23/2020 at 11 am with Dr.Silva. Patient is agreeable to date and time.

## 2020-01-20 ENCOUNTER — Telehealth: Payer: Self-pay

## 2020-01-20 NOTE — Telephone Encounter (Signed)
Patient is calling to reschedule surgery. Patient stated she wanted to go ahead and cancel the pre op appointment.

## 2020-01-22 ENCOUNTER — Emergency Department (HOSPITAL_BASED_OUTPATIENT_CLINIC_OR_DEPARTMENT_OTHER)
Admission: EM | Admit: 2020-01-22 | Discharge: 2020-01-22 | Disposition: A | Discharge status: Home / Self Care | Payer: BC Managed Care – PPO | Attending: Emergency Medicine | Admitting: Emergency Medicine

## 2020-01-22 ENCOUNTER — Encounter (HOSPITAL_BASED_OUTPATIENT_CLINIC_OR_DEPARTMENT_OTHER): Payer: Self-pay | Admitting: *Deleted

## 2020-01-22 ENCOUNTER — Other Ambulatory Visit: Payer: Self-pay

## 2020-01-22 DIAGNOSIS — E0789 Other specified disorders of thyroid: Secondary | ICD-10-CM | POA: Diagnosis not present

## 2020-01-22 DIAGNOSIS — T63441A Toxic effect of venom of bees, accidental (unintentional), initial encounter: Secondary | ICD-10-CM | POA: Insufficient documentation

## 2020-01-22 DIAGNOSIS — Z79899 Other long term (current) drug therapy: Secondary | ICD-10-CM | POA: Diagnosis not present

## 2020-01-22 DIAGNOSIS — R2241 Localized swelling, mass and lump, right lower limb: Secondary | ICD-10-CM | POA: Diagnosis present

## 2020-01-22 NOTE — Telephone Encounter (Signed)
Left message to call Caylie Sandquist at [phone]. 

## 2020-01-22 NOTE — Discharge Instructions (Addendum)
The redness and warmth you are experiencing is normal after being stung by a bee.  The itching can be helped by Benadryl.

## 2020-01-22 NOTE — ED Triage Notes (Signed)
C/o bee sting to right arm x 2 days ago

## 2020-01-22 NOTE — ED Provider Notes (Signed)
[facility] EMERGENCY DEPARTMENT Provider Note   CSN: [id number] Arrival date & time: 01/22/20  1936     History Chief Complaint  Patient presents with  . Insect Bite    Monica Lawrence is a 44 y.o. female.  Patient presents with complaints of irritation to the site where she had to bee stings 2 days ago.  Patient reports that the area is still swollen, warm to the touch.  Patient reports that the area is very itchy.  No tongue swelling, throat swelling, difficulty breathing.  No rash or swelling anywhere other than where she was stung.        Past Medical History:  Diagnosis Date  . High cholesterol   . Thyroid disease     There are no problems to display for this patient.   History reviewed. No pertinent surgical history.   OB History   No obstetric history on file.     Family History  Problem Relation Age of Onset  . Healthy Mother   . Healthy Father     Social History   Tobacco Use  . Smoking status: Never Smoker  . Smokeless tobacco: Never Used  Substance Use Topics  . Alcohol use: No  . Drug use: No    Home Medications Prior to Admission medications   Medication Sig Start Date End Date Taking? Authorizing Provider  benzonatate (TESSALON) 100 MG capsule Take 1 capsule (100 mg total) by mouth every 8 (eight) hours. 10/24/19   Merrilee Jansky, MD  loratadine (CLARITIN) 10 MG tablet Take 1 tablet (10 mg total) by mouth daily. 10/24/19   Merrilee Jansky, MD  atorvastatin (LIPITOR) 10 MG tablet TK 1 T PO HS 07/22/17 10/10/19  [provider]  fluticasone (FLONASE) 50 MCG/ACT nasal spray 1 spray.  10/10/19  [provider]    Allergies    Ambien [zolpidem tartrate] and Neomycin  Review of Systems   Review of Systems  HENT: Negative.   Respiratory: Negative.   Skin: Positive for color change.    Physical Exam Updated Vital Signs Wt 77.1 kg   BMI 33.20 kg/m   Physical Exam Constitutional:      Appearance: Normal  appearance.  HENT:     Head: Normocephalic and atraumatic.  Musculoskeletal:     Right forearm: Swelling and edema present.     Comments: Well-circumscribed area of erythema, warmth and swelling volar aspect of right forearm surrounding the 2 sites of bee sting  Neurological:     Mental Status: She is alert.     ED Results / Procedures / Treatments   Labs (all labs ordered are listed, but only abnormal results are displayed) Labs Reviewed - No data to display  EKG None  Radiology No results found.  Procedures Procedures (including critical care time)  Medications Ordered in ED Medications - No data to display  ED Course  I have reviewed the triage vital signs and the nursing notes.  Pertinent labs & imaging results that were available during my care of the patient were reviewed by me and considered in my medical decision making (see chart for details).    MDM Rules/Calculators/A&P                          Patient presents with symptoms of local reaction to hymenoptera envenomation.  No sign of systemic reaction.  Patient reassured, Benadryl for itch, elevate and ice the area.  Final Clinical Impression(s) /  ED Diagnoses Final diagnoses:  Bee sting reaction, accidental or unintentional, initial encounter    Rx / DC Orders ED Discharge Orders    None       Kataya Guimont, Canary Brim, MD 01/22/20 [phone]

## 2020-01-23 ENCOUNTER — Ambulatory Visit: Payer: BC Managed Care – PPO | Admitting: Obstetrics and Gynecology

## 2020-01-28 NOTE — Telephone Encounter (Signed)
Spoke with patient. Patient states that her daughter woke up on 01/25/2020 with a fever. Daughter tested positive for COVID over the weekend and she needs to cancel her surgery fir 02/03/2020. Patient would like to call back to reschedule after she knows how she and the rest of her family will do and to ensure she does not get sick.  Surgery for 02/03/2020 cancelled with central scheduling.  Routing to provider and will close encounter.

## 2020-01-28 NOTE — Telephone Encounter (Signed)
Patient calling to reschedule surgery and follow up appointments. Patient states her daughter tested positive for Covid.

## 2020-01-30 ENCOUNTER — Other Ambulatory Visit (HOSPITAL_COMMUNITY): Payer: Self-pay

## 2020-02-03 ENCOUNTER — Encounter (HOSPITAL_BASED_OUTPATIENT_CLINIC_OR_DEPARTMENT_OTHER): Admission: RE | Payer: Self-pay | Source: Home / Self Care

## 2020-02-03 ENCOUNTER — Ambulatory Visit (HOSPITAL_BASED_OUTPATIENT_CLINIC_OR_DEPARTMENT_OTHER)
Admission: RE | Admit: 2020-02-03 | Payer: BC Managed Care – PPO | Source: Home / Self Care | Admitting: Obstetrics and Gynecology

## 2020-02-03 SURGERY — DILATATION & CURETTAGE/HYSTEROSCOPY WITH MYOSURE
Anesthesia: Choice

## 2020-02-04 ENCOUNTER — Other Ambulatory Visit: Payer: Self-pay

## 2020-02-04 DIAGNOSIS — N912 Amenorrhea, unspecified: Secondary | ICD-10-CM

## 2020-02-04 MED ORDER — NORETHINDRONE ACETATE 5 MG PO TABS: 5.0000 mg | ORAL_TABLET | Freq: Two times a day (BID) | ORAL | 0 refills | Status: DC

## 2020-02-04 NOTE — Telephone Encounter (Signed)
Medication refill request: Norethindrone 5mg   Last AEX:  04/29/19 Next AEX: 04/30/20 Last MMG (if hormonal medication request): 01/02/20  Negative  Refill authorized: 60/1

## 2020-02-18 ENCOUNTER — Ambulatory Visit: Payer: BC Managed Care – PPO | Admitting: Obstetrics and Gynecology

## 2020-02-26 ENCOUNTER — Other Ambulatory Visit: Payer: Self-pay | Admitting: *Deleted

## 2020-02-26 ENCOUNTER — Telehealth: Payer: Self-pay | Admitting: Obstetrics and Gynecology

## 2020-02-26 DIAGNOSIS — N912 Amenorrhea, unspecified: Secondary | ICD-10-CM

## 2020-02-26 MED ORDER — NORETHINDRONE ACETATE 5 MG PO TABS: 5.0000 mg | ORAL_TABLET | Freq: Two times a day (BID) | ORAL | 0 refills | Status: DC

## 2020-02-26 NOTE — Telephone Encounter (Signed)
Please contact patient regarding rescheduling of her hysteroscopy surgery, which was cancelled due to a family member testing positive for Covid.   I just received a refill request for more Aygestin.   Patient has an endometrial polyp and fibroids, and our plan was to proceed with hysteroscopic polypectomy and not hysterectomy.   How is she doing?

## 2020-02-26 NOTE — Telephone Encounter (Signed)
Medication refill request: Norethindrone  Last AEX:  04-29-2019 BS Next AEX: 04-30-20 Last MMG (if hormonal medication request): 01-02-20 density B/BIRADS 1 negative  Refill authorized: Today, please advise.   Medication pended for #60, 1RF. Please refill if appropriate.

## 2020-02-26 NOTE — Telephone Encounter (Signed)
Left message to call Sian Rockers at [phone]. 

## 2020-03-04 NOTE — Telephone Encounter (Signed)
Left message to call Jarvis Knodel at [phone]. 

## 2020-03-06 NOTE — Telephone Encounter (Signed)
Dr.Silva, patient has not returned calls x 2. Would you like me to send a letter?

## 2020-03-09 NOTE — Telephone Encounter (Signed)
Letter to Dr.Silva for review.

## 2020-03-09 NOTE — Telephone Encounter (Signed)
Please send a letter to her.  Thank you.

## 2020-03-10 ENCOUNTER — Ambulatory Visit: Payer: BC Managed Care – PPO | Admitting: Neurology

## 2020-03-10 NOTE — Telephone Encounter (Signed)
Letter mailed to patient's home address on file. Encounter closed.

## 2020-04-30 ENCOUNTER — Ambulatory Visit: Payer: BC Managed Care – PPO | Admitting: Obstetrics and Gynecology

## 2020-05-25 NOTE — Progress Notes (Deleted)
NEUROLOGY CONSULTATION NOTE  Lynn Spears MRN: [id number] DOB: 01-Apr-1976  Referring provider: Lanelle Bal, PA-C Primary care provider: Judd Lien, MD  Reason for consult:  Left sided numbness and weakness   Subjective:  Lynn Spears is a 45 year old ***-handed female with HTN who presents for left-sided numbness and weakness.  History supplemented by referring provider's note.  On 08/22/2019, she developed sudden onset left sided numbness and tingling of the left arm and leg.  Blood pressure was 142/100.  ***.  She saw her PCP who ordered a stat CT head which was negative.  ***.  MRI of brain without contrast on 09/10/2019 was negative.       PAST MEDICAL HISTORY: Past Medical History:  Diagnosis Date  . Allergic rhinitis   . Anemia    04-29-19 patient having iron infusions--[facility]  . Anxiety   . Chronic headaches   . COVID-19 04/2019  . Diverticulosis   . Elevated prolactin level 09/19/2019  . Essential hypertension   . Fibroid    Ultrasound 02/12/19 - [place] - left fundal 6.6 x 6.0 x 6.9 cm, left lateral 4.1 x 3.9 x 3.3 cm.   Marland Kitchen GERD (gastroesophageal reflux disease)   . Hx of adenomatous colonic polyps   . IBS (irritable bowel syndrome)   . Palpitations   . Seasonal allergies     PAST SURGICAL HISTORY: Past Surgical History:  Procedure Laterality Date  . WISDOM TOOTH EXTRACTION      MEDICATIONS: Current Outpatient Medications on File Prior to Visit  Medication Sig Dispense Refill  . cetirizine (ZYRTEC) 10 MG tablet Take 10 mg by mouth daily.    . Cholecalciferol (D 5000) 125 MCG (5000 UT) capsule Take 1 capsule by mouth daily.    . clonazePAM (KLONOPIN) 0.5 MG tablet Take 0.5 mg by mouth 2 (two) times daily as needed.    . cyanocobalamin (,VITAMIN B-12,) 1000 MCG/ML injection Inject into the muscle.    . cyclobenzaprine (FLEXERIL) 10 MG tablet Take 10 mg by mouth 3 (three) times daily as needed.     . gabapentin (NEURONTIN) 100 MG  capsule Take 300 mg by mouth at bedtime as needed. Take 100-300 mg at bedtime as needed    . metoprolol succinate (TOPROL-XL) 25 MG 24 hr tablet Take 25 mg by mouth daily.  (Patient not taking: Reported on 11/14/2019)    . metoprolol succinate (TOPROL-XL) 50 MG 24 hr tablet Take 50 mg by mouth daily.    . norethindrone (AYGESTIN) 5 MG tablet Take 1 tablet (5 mg total) by mouth 2 (two) times daily. 60 tablet 0   No current facility-administered medications on file prior to visit.    ALLERGIES: Allergies  Allergen Reactions  . Biaxin [Clarithromycin] Other (See Comments)    GI upset  . Cymbalta [Duloxetine Hcl]     Vomiting   . Erythromycin     GI upset  . Hemophilus B Polysaccharide Vaccine     Neurologic reaction.  . Influenza Virus Vaccine     Neurologic reaction.  . Lexapro [Escitalopram Oxalate]     Flu like Sx  . Macrobid [Nitrofurantoin] Nausea And Vomiting  . Penicillins Rash  . Sulfa Antibiotics Rash    FAMILY HISTORY: Family History  Problem Relation Age of Onset  . Hypertension Mother   . CAD Mother        CABG at age 21  . Colon polyps Mother 54       pre-cancerous  .  Polycystic kidney disease Mother   . Diabetes Maternal Grandmother   . Stroke Maternal Grandmother   . Colon cancer Maternal Grandmother        in her 66's  . CAD Father        CABG in his late 68s  . Ovarian cancer Maternal Aunt   . Throat cancer Maternal Uncle   . Pancreatic cancer Maternal Aunt   . Scoliosis Daughter   . Healthy Daughter   . Esophageal cancer Neg Hx   . Rectal cancer Neg Hx   . Stomach cancer Neg Hx    ***.  SOCIAL HISTORY: Social History   Socioeconomic History  . Marital status: Married    Spouse name: Not on file  . Number of children: 1  . Years of education: some college  . Highest education level: Not on file  Occupational History  . Occupation: [employer] Services operational officer  Tobacco Use  . Smoking status: Never Smoker  . Smokeless tobacco: Never Used  Vaping Use  .  Vaping Use: Never used  Substance and Sexual Activity  . Alcohol use: Yes    Comment: Occasional  . Drug use: No  . Sexual activity: Not on file  Other Topics Concern  . Not on file  Social History Narrative   Lives at home with her husband.   Right-handed.   1 cup caffeine per day.   Social Determinants of Health   Financial Resource Strain: Not on file  Food Insecurity: Not on file  Transportation Needs: Not on file  Physical Activity: Not on file  Stress: Not on file  Social Connections: Not on file  Intimate Partner Violence: Not on file    Objective:  *** General: No acute distress.  Patient appears well-groomed.   Head:  Normocephalic/atraumatic Eyes:  fundi examined but not visualized Neck: supple, no paraspinal tenderness, full range of motion Back: No paraspinal tenderness Heart: regular rate and rhythm Lungs: Clear to auscultation bilaterally. Vascular: No carotid bruits. Neurological Exam: Mental status: alert and oriented to person, place, and time, recent and remote memory intact, fund of knowledge intact, attention and concentration intact, speech fluent and not dysarthric, language intact. Cranial nerves: CN I: not tested CN II: pupils equal, round and reactive to light, visual fields intact CN III, IV, VI:  full range of motion, no nystagmus, no ptosis CN V: facial sensation intact. CN VII: upper and lower face symmetric CN VIII: hearing intact CN IX, X: gag intact, uvula midline CN XI: sternocleidomastoid and trapezius muscles intact CN XII: tongue midline Bulk & Tone: normal, no fasciculations. Motor:  muscle strength 5/5 throughout Sensation:  Pinprick, temperature and vibratory sensation intact. Deep Tendon Reflexes:  2+ throughout,  toes downgoing.   Finger to nose testing:  Without dysmetria.   Heel to shin:  Without dysmetria.   Gait:  Normal station and stride.  Romberg negative.  Assessment/Plan:   ***    Thank you for allowing me  to take part in the care of this patient.  Shon Millet, DO  CC: ***

## 2020-05-26 ENCOUNTER — Ambulatory Visit: Payer: BC Managed Care – PPO | Admitting: Neurology

## 2020-08-05 ENCOUNTER — Encounter: Payer: Self-pay | Admitting: Internal Medicine

## 2020-09-10 ENCOUNTER — Ambulatory Visit: Payer: BC Managed Care – PPO | Admitting: Obstetrics and Gynecology

## 2020-10-22 ENCOUNTER — Ambulatory Visit: Payer: Self-pay | Admitting: Obstetrics and Gynecology

## 2020-11-02 ENCOUNTER — Ambulatory Visit: Payer: BC Managed Care – PPO | Admitting: Nutrition

## 2020-11-05 ENCOUNTER — Emergency Department (HOSPITAL_BASED_OUTPATIENT_CLINIC_OR_DEPARTMENT_OTHER)
Admission: EM | Admit: 2020-11-05 | Discharge: 2020-11-05 | Disposition: A | Discharge status: Home / Self Care | Payer: Medicaid Other | Attending: Emergency Medicine | Admitting: Emergency Medicine

## 2020-11-05 ENCOUNTER — Encounter (HOSPITAL_BASED_OUTPATIENT_CLINIC_OR_DEPARTMENT_OTHER): Payer: Self-pay

## 2020-11-05 ENCOUNTER — Other Ambulatory Visit: Payer: Self-pay

## 2020-11-05 DIAGNOSIS — M62838 Other muscle spasm: Secondary | ICD-10-CM | POA: Diagnosis not present

## 2020-11-05 DIAGNOSIS — M542 Cervicalgia: Secondary | ICD-10-CM | POA: Diagnosis not present

## 2020-11-05 DIAGNOSIS — Z79899 Other long term (current) drug therapy: Secondary | ICD-10-CM | POA: Diagnosis not present

## 2020-11-05 MED ORDER — DIAZEPAM 5 MG PO TABS
5.0000 mg | ORAL_TABLET | Freq: Once | ORAL | Status: AC
  Administered 2020-11-05: 5 mg via ORAL
  Filled 2020-11-05: qty 1

## 2020-11-05 MED ORDER — METHOCARBAMOL 500 MG PO TABS
1000.0000 mg | ORAL_TABLET | Freq: Four times a day (QID) | ORAL | 0 refills | Status: AC
Start: 2020-11-05 — End: ?

## 2020-11-05 MED ORDER — KETOROLAC TROMETHAMINE 30 MG/ML IJ SOLN
30.0000 mg | Freq: Once | INTRAMUSCULAR | Status: AC
  Administered 2020-11-05: 30 mg via INTRAMUSCULAR
  Filled 2020-11-05: qty 1

## 2020-11-05 NOTE — Discharge Instructions (Signed)
Please read and follow all provided instructions.  Your diagnoses today include:  1. Trapezius muscle spasm     Tests performed today include: Vital signs - see below for your results today  Medications prescribed:  Robaxin (methocarbamol) - muscle relaxer medication  DO NOT drive or perform any activities that require you to be awake and alert because this medicine can make you drowsy.   Take any prescribed medications only as directed.  Home care instructions:  Follow any educational materials contained in this packet Please rest, use  heat on your neck for the next several days Do not lift, push, pull anything more than 10 pounds for the next week  Follow-up instructions: Please follow-up with your primary care provider in the next 1 week for further evaluation of your symptoms if not improved.   Return instructions:  SEEK IMMEDIATE MEDICAL ATTENTION IF YOU HAVE: New numbness, tingling, weakness, or problem with the use of your arms or legs Severe back or neck pain not relieved with medications Loss control of your bowels or bladder Increasing pain in any areas of the body (such as chest or abdominal pain) Shortness of breath, dizziness, or fainting.  Worsening nausea (feeling sick to your stomach), vomiting, fever, or sweats Any other emergent concerns regarding your health   Additional Information:  Your vital signs today were: BP 131/78 (BP Location: Right Arm)   Pulse 70   Temp 98.5 F (36.9 C) (Oral)   Resp 18   Ht 5' (1.524 m)   Wt 80.7 kg   LMP 10/21/2020   SpO2 99%   BMI 34.76 kg/m  If your blood pressure (BP) was elevated above 135/85 this visit, please have this repeated by your doctor within one month. --------------

## 2020-11-05 NOTE — ED Provider Notes (Signed)
[facility] EMERGENCY DEPARTMENT Provider Note   CSN: [id number] Arrival date & time: 11/05/20  1711     History Chief Complaint  Patient presents with   Neck Pain    Monica Lawrence is a 45 y.o. female.  Patient presents for evaluation of left-sided lateral neck pain starting approximately 11 AM today after she woke up from a nap.  Patient states that after waking up she had significant stiffness and pain in her left posterior lateral neck.  Pain is worse with movement especially when she tries to look to the right.  When she looks left she feels a pulling sensation.  Movement of her arm also makes the area hurt more.  The spasms wax and wane.  She denies any significant recent injuries.  No numbness, tingling, or weakness in the arms or the legs.  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  Pain is severe with movement.      Past Medical History:  Diagnosis Date   High cholesterol    Thyroid disease     There are no problems to display for this patient.   History reviewed. No pertinent surgical history.   OB History   No obstetric history on file.     Family History  Problem Relation Age of Onset   Healthy Mother    Healthy Father     Social History   Tobacco Use   Smoking status: Never   Smokeless tobacco: Never  Substance Use Topics   Alcohol use: No   Drug use: No    Home Medications Prior to Admission medications   Medication Sig Start Date End Date Taking? Authorizing Provider  benzonatate (TESSALON) 100 MG capsule Take 1 capsule (100 mg total) by mouth every 8 (eight) hours. 10/24/19   Merrilee Jansky, MD  loratadine (CLARITIN) 10 MG tablet Take 1 tablet (10 mg total) by mouth daily. 10/24/19   Merrilee Jansky, MD  atorvastatin (LIPITOR) 10 MG tablet TK 1 T PO HS 07/22/17 10/10/19  [provider]  fluticasone (FLONASE) 50 MCG/ACT nasal spray 1 spray.  10/10/19  [provider]     Allergies    Ambien [zolpidem tartrate] and Neomycin  Review of Systems   Review of Systems  Eyes:  Negative for visual disturbance.  Respiratory:  Negative for shortness of breath.   Cardiovascular:  Negative for chest pain.  Gastrointestinal:        Negative for fecal incontinence.   Genitourinary:        Negative for urinary incontinence or retention.  Musculoskeletal:  Positive for neck pain. Negative for back pain.  Neurological:  Negative for weakness and numbness.       Denies saddle paresthesias.   Physical Exam Updated Vital Signs BP 131/78 (BP Location: Right Arm)   Pulse 70   Temp 98.5 F (36.9 C) (Oral)   Resp 18   Ht 5' (1.524 m)   Wt 80.7 kg   LMP 10/21/2020   SpO2 99%   BMI 34.76 kg/m   Physical Exam Vitals and nursing note reviewed.  Constitutional:      Appearance: She is well-developed.  HENT:     Head: Normocephalic and atraumatic.     Mouth/Throat:     Mouth: Mucous membranes are moist.  Eyes:     Comments: Right eye: Patient with artificial eye, nonresponsive to light, normal movement.  Left eye: Pupil is equal, round  and reactive to light.  Mild scleral injection.  Neck:     Vascular: No carotid bruit.     Comments: No carotid bruit auscultated.  Decreased range of motion of the neck, especially on lateral rotation, due to spasm in the left neck. Cardiovascular:     Rate and Rhythm: Normal rate.  Pulmonary:     Effort: No respiratory distress.  Musculoskeletal:     Cervical back: Spasms present. Normal range of motion.     Comments: Palpable spasm, left-sided, trapezius muscle and lateral neck.trapezius and lateral neck.   Skin:    General: Skin is warm and dry.  Neurological:     Mental Status: She is alert.      ED Results / Procedures / Treatments   Labs (all labs ordered are listed, but only abnormal results are displayed) Labs Reviewed - No data to display  EKG None  Radiology No results  found.  Procedures Procedures   Medications Ordered in ED Medications  ketorolac (TORADOL) 30 MG/ML injection 30 mg (has no administration in time range)  diazepam (VALIUM) tablet 5 mg (has no administration in time range)    ED Course  I have reviewed the triage vital signs and the nursing notes.  Pertinent labs & imaging results that were available during my care of the patient were reviewed by me and considered in my medical decision making (see chart for details).  Patient seen and examined.  Patient with muscle spasm in the neck.  Will treat with Toradol and oral Valium she.  She does have a ride home.  Will give prescription for Robaxin for home.  Encouraged continued heat on the area which seems to help.  Vital signs reviewed and are as follows: BP 131/78 (BP Location: Right Arm)   Pulse 70   Temp 98.5 F (36.9 C) (Oral)   Resp 18   Ht 5' (1.524 m)   Wt 80.7 kg   LMP 10/21/2020   SpO2 99%   BMI 34.76 kg/m   Patient counseled on proper use of muscle relaxant medication.  They were told not to drink alcohol, drive any vehicle, or do any dangerous activities while taking this medication.  Patient verbalized understanding.     MDM Rules/Calculators/A&P                          Patient with neck spasm.  No neurologic deficits.  Left upper extremity is neurovascularly intact.  Do not suspect central cord issue.  Very low concern for dissection in the neck.  No bruits.  She is very tender over the left trapezius area with associated spasm.   Final Clinical Impression(s) / ED Diagnoses Final diagnoses:  Trapezius muscle spasm    Rx / DC Orders ED Discharge Orders          Ordered    methocarbamol (ROBAXIN) 500 MG tablet  4 times daily        11/05/20 1818             Renne Crigler, Cordelia Poche 11/05/20 Jeri Lager, MD 11/08/20 1015

## 2020-11-05 NOTE — ED Triage Notes (Signed)
Pt c/o pain to posterior neck and left shoulder "muscle spasms" x today-denies injury-to triage in w/c-NAD

## 2020-11-17 ENCOUNTER — Ambulatory Visit: Payer: BC Managed Care – PPO | Admitting: Nutrition

## 2020-11-20 ENCOUNTER — Telehealth: Payer: Self-pay | Admitting: *Deleted

## 2020-11-20 ENCOUNTER — Ambulatory Visit (AMBULATORY_SURGERY_CENTER): Payer: BC Managed Care – PPO | Admitting: *Deleted

## 2020-11-20 ENCOUNTER — Other Ambulatory Visit: Payer: Self-pay

## 2020-11-20 VITALS — Ht 67.0 in | Wt 186.0 lb

## 2020-11-20 DIAGNOSIS — Z86010 Personal history of colonic polyps: Secondary | ICD-10-CM

## 2020-11-20 MED ORDER — SUTAB 1479-225-188 MG PO TABS: 1.0000 | ORAL_TABLET | ORAL | 0 refills | Status: DC

## 2020-11-20 NOTE — Progress Notes (Signed)
Patient's pre-visit was done today over the phone with the patient due to COVID-19 pandemic. Name,DOB and address verified. Insurance verified. Patient denies any allergies to Eggs and Soy. Patient denies any problems with anesthesia/sedation. Patient denies taking diet pills or blood thinners. Packet of Prep instructions mailed to patient including a copy of a consent form-pt is aware. Patient understands to call us back with any questions or concerns. Patient is aware of our care-partner policy and GURKY-70 safety protocol.   EMMI education assigned to the patient for the procedure, sent to [facility].   The patient is COVID-19 vaccinated, per patient.

## 2020-11-20 NOTE — Telephone Encounter (Signed)
Dr.Perry,  Please review this patient's chart. She is scheduled to have recall colon with you on 7/19, hx colon polyp x1. She was explaining to me that she has been receiving IRON infusion due to low counts and that her Hematology dr and PCP wants her to consult with GI to see if the EGD is needed or any other tests. Please advise. Thank you, Damon Baisch pv.

## 2020-11-21 NOTE — Telephone Encounter (Signed)
Keep colonoscopy as planned. We can decide on the need for EGD depending upon colonoscopy results. Thanks

## 2020-11-24 NOTE — Telephone Encounter (Signed)
Called patient, no answer.left a message explaining Dr.Perry's recommendations.

## 2020-11-30 NOTE — Progress Notes (Deleted)
45 y.o. G47P1001 Married Caucasian female here for annual exam.    PCP:     No LMP recorded.           Sexually active: {yes no:314532}  The current method of family planning is {contraception:315051}.    Exercising: {yes no:314532}  {types:19826} Smoker:  no  Health Maintenance: Pap:  09-10-14 normal Neg HPV History of abnormal Pap:  no MMG:  01-02-20 normal Colonoscopy:  06-03-25 polyp removed BMD:   N/A  Result   TDaP:  *** Gardasil:   no HIV:neg Hep C:neg Screening Labs:  Hb today:PCP Urine today: PCP   reports that she has never smoked. She has never used smokeless tobacco. She reports current alcohol use. She reports that she does not use drugs.  Past Medical History:  Diagnosis Date   Allergic rhinitis    Anemia    04-29-19 patient having iron infusions--[facility]   Anxiety    Chronic headaches    COVID-19 04/2019   Diverticulosis    Elevated prolactin level 09/19/2019   Essential hypertension    Fibroid    Ultrasound 02/12/19 - [facility] - left fundal 6.6 x 6.0 x 6.9 cm, left lateral 4.1 x 3.9 x 3.3 cm.    GERD (gastroesophageal reflux disease)    Heart murmur    SVT   Hx of adenomatous colonic polyps    IBS (irritable bowel syndrome)    Palpitations    Seasonal allergies    Sleep apnea    CPAP    Past Surgical History:  Procedure Laterality Date   COLONOSCOPY Bilateral 06/04/2015   WISDOM TOOTH EXTRACTION      Current Outpatient Medications  Medication Sig Dispense Refill   cetirizine (ZYRTEC) 10 MG tablet Take 10 mg by mouth daily.     Cholecalciferol (D 5000) 125 MCG (5000 UT) capsule Take 1 capsule by mouth daily.     clonazePAM (KLONOPIN) 0.5 MG tablet Take 0.5 mg by mouth 2 (two) times daily as needed.     cyanocobalamin (,VITAMIN B-12,) 1000 MCG/ML injection Inject into the muscle.     Cyanocobalamin (VITAMIN B-12 PO) Take by mouth.     cyclobenzaprine (FLEXERIL) 10 MG tablet Take 10 mg by mouth 3 (three) times daily as needed.       gabapentin (NEURONTIN) 100 MG capsule Take 300 mg by mouth at bedtime as needed. Take 100-300 mg at bedtime as needed     metoprolol succinate (TOPROL-XL) 25 MG 24 hr tablet Take 25 mg by mouth as needed.     metoprolol succinate (TOPROL-XL) 50 MG 24 hr tablet Take 50 mg by mouth daily.     Sodium Sulfate-Mag Sulfate-KCl (SUTAB) [phone] MG TABS Take 1 kit by mouth as directed. MANUFACTURER CODES!! BIN: [id number] PCN: CN GROUP: [id number] MEMBER ID: [id number];[employer] AS SECONDARY INSURANCE ;NO PRIOR AUTHORIZATION 24 tablet 0   No current facility-administered medications for this visit.    Family History  Problem Relation Age of Onset   Hypertension Mother    CAD Mother        CABG at age 6   Colon polyps Mother 97       pre-cancerous   Polycystic kidney disease Mother    Diabetes Maternal Grandmother    Stroke Maternal Grandmother    Colon cancer Maternal Grandmother        in her 78's   CAD Father        CABG in his late 41s   Ovarian cancer  Maternal Aunt    Throat cancer Maternal Uncle    Pancreatic cancer Maternal Aunt    Scoliosis Daughter    Healthy Daughter    Esophageal cancer Neg Hx    Rectal cancer Neg Hx    Stomach cancer Neg Hx     Review of Systems  Exam:   There were no vitals taken for this visit.    General appearance: alert, cooperative and appears stated age Head: normocephalic, without obvious abnormality, atraumatic Neck: no adenopathy, supple, symmetrical, trachea midline and thyroid normal to inspection and palpation Lungs: clear to auscultation bilaterally Breasts: normal appearance, no masses or tenderness, No nipple retraction or dimpling, No nipple discharge or bleeding, No axillary adenopathy Heart: regular rate and rhythm Abdomen: soft, non-tender; no masses, no organomegaly Extremities: extremities normal, atraumatic, no cyanosis or edema Skin: skin color, texture, turgor normal. No rashes or lesions Lymph nodes: cervical, supraclavicular, and  axillary nodes normal. Neurologic: grossly normal  Pelvic: External genitalia:  no lesions              No abnormal inguinal nodes palpated.              Urethra:  normal appearing urethra with no masses, tenderness or lesions              Bartholins and Skenes: normal                 Vagina: normal appearing vagina with normal color and discharge, no lesions              Cervix: no lesions              Pap taken: {yes no:314532} Bimanual Exam:  Uterus:  normal size, contour, position, consistency, mobility, non-tender              Adnexa: no mass, fullness, tenderness              Rectal exam: {yes no:314532}.  Confirms.              Anus:  normal sphincter tone, no lesions  Chaperone was present for exam.  Assessment:   Well woman visit with normal exam.   Plan: Mammogram screening discussed. Self breast awareness reviewed. Pap and HR HPV as above. Guidelines for Calcium, Vitamin D, regular exercise program including cardiovascular and weight bearing exercise.   Follow up annually and prn.   Additional counseling given.  {yes [id number]. _______ minutes face to face time of which over 50% was spent in counseling.    After visit summary provided.

## 2020-12-04 ENCOUNTER — Ambulatory Visit: Payer: Self-pay | Admitting: Obstetrics and Gynecology

## 2020-12-08 ENCOUNTER — Encounter: Payer: Self-pay | Admitting: Internal Medicine

## 2020-12-08 ENCOUNTER — Ambulatory Visit (AMBULATORY_SURGERY_CENTER): Payer: BC Managed Care – PPO | Admitting: Internal Medicine

## 2020-12-08 ENCOUNTER — Other Ambulatory Visit: Payer: Self-pay

## 2020-12-08 VITALS — BP 124/78 | HR 67 | Temp 98.9°F | Resp 18 | Ht 67.0 in | Wt 186.0 lb

## 2020-12-08 DIAGNOSIS — D125 Benign neoplasm of sigmoid colon: Secondary | ICD-10-CM | POA: Diagnosis not present

## 2020-12-08 DIAGNOSIS — Z86010 Personal history of colonic polyps: Secondary | ICD-10-CM

## 2020-12-08 DIAGNOSIS — K635 Polyp of colon: Secondary | ICD-10-CM | POA: Diagnosis not present

## 2020-12-08 DIAGNOSIS — D122 Benign neoplasm of ascending colon: Secondary | ICD-10-CM

## 2020-12-08 MED ORDER — SODIUM CHLORIDE 0.9 % IV SOLN: 500.0000 mL | Freq: Once | INTRAVENOUS | Status: AC

## 2020-12-08 NOTE — Progress Notes (Signed)
Called to room to assist during endoscopic procedure.  Patient ID and intended procedure confirmed with present staff. Received instructions for my participation in the procedure from the performing physician.  

## 2020-12-08 NOTE — Op Note (Signed)
[facility] [place] Patient Name: Lynn Spears Procedure Date: 12/08/2020 2:19 PM MRN: [id number] Endoscopist: Docia Chuck. Henrene Pastor , MD Age: 45 Referring MD:  Date of Birth: [birth year] Gender: Female Account #: [id number] Procedure:                Colonoscopy with cold snare polypectomy x 4; with                            biopsies Indications:              High risk colon cancer surveillance: Personal                            history of non-advanced adenoma. Previous                            examination January 2017 Medicines:                Monitored Anesthesia Care Procedure:                Pre-Anesthesia Assessment:                           - Prior to the procedure, a History and Physical                            was performed, and patient medications and                            allergies were reviewed. The patient's tolerance of                            previous anesthesia was also reviewed. The risks                            and benefits of the procedure and the sedation                            options and risks were discussed with the patient.                            All questions were answered, and informed consent                            was obtained. Prior Anticoagulants: The patient has                            taken no previous anticoagulant or antiplatelet                            agents. ASA Grade Assessment: II - A patient with                            mild systemic disease. After reviewing the risks  and benefits, the patient was deemed in                            satisfactory condition to undergo the procedure.                           After obtaining informed consent, the colonoscope                            was passed under direct vision. Throughout the                            procedure, the patient's blood pressure, pulse, and                            oxygen saturations were monitored continuously. The                             PCF-HQ190L Colonoscope was introduced through the                            anus and advanced to the the cecum, identified by                            appendiceal orifice and ileocecal valve. The                            ileocecal valve, appendiceal orifice, and rectum                            were photographed. The quality of the bowel                            preparation was excellent. The colonoscopy was                            performed without difficulty. The patient tolerated                            the procedure well. The bowel preparation used was                            SUPREP via split dose instruction. Scope In: 2:36:01 PM Scope Out: 2:51:33 PM Scope Withdrawal Time: 0 hours 13 minutes 52 seconds  Total Procedure Duration: 0 hours 15 minutes 32 seconds  Findings:                 Four polyps were found in the ascending colon. The                            polyps were 1 to 5 mm in size. These polyps were                            removed with a cold snare.  Resection and retrieval                            were complete.                           A 1 mm polyp was found in the sigmoid colon.                            Biopsies were taken with a cold forceps for                            histology.                           Multiple diverticula were found in the right colon.                           The exam was otherwise without abnormality on                            direct and retroflexion views. Complications:            No immediate complications. Estimated blood loss:                            None. Estimated Blood Loss:     Estimated blood loss: none. Impression:               - Four 1 to 5 mm polyps in the ascending colon,                            removed with a cold snare. Resected and retrieved.                           - One 1 mm polyp in the sigmoid colon. Biopsied.                           - Diverticulosis in the  right colon.                           - The examination was otherwise normal on direct                            and retroflexion views. Recommendation:           - Repeat colonoscopy in 3 - 5 years for                            surveillance.                           - Patient has a contact number available for                            emergencies. The signs and symptoms of potential  delayed complications were discussed with the                            patient. Return to normal activities tomorrow.                            Written discharge instructions were provided to the                            patient.                           - Resume previous diet.                           - Continue present medications.                           - Await pathology results. Docia Chuck. Henrene Pastor, MD 12/08/2020 2:57:34 PM This report has been signed electronically.

## 2020-12-08 NOTE — Progress Notes (Signed)
Pt's states no medical or surgical changes since previsit or office visit. 

## 2020-12-08 NOTE — Patient Instructions (Addendum)
Information on polyps and diverticulosis given to you today.  Await pathology results.  Resume previous diet and medications.  Repeat colonoscopy in 3-5 years.  YOU HAD AN ENDOSCOPIC PROCEDURE TODAY AT [facility]:   Refer to the procedure report that was given to you for any specific questions about what was found during the examination.  If the procedure report does not answer your questions, please call your gastroenterologist to clarify.  If you requested that your care partner not be given the details of your procedure findings, then the procedure report has been included in a sealed envelope for you to review at your convenience later.  YOU SHOULD EXPECT: Some feelings of bloating in the abdomen. Passage of more gas than usual.  Walking can help get rid of the air that was put into your GI tract during the procedure and reduce the bloating. If you had a lower endoscopy (such as a colonoscopy or flexible sigmoidoscopy) you may notice spotting of blood in your stool or on the toilet paper. If you underwent a bowel prep for your procedure, you may not have a normal bowel movement for a few days.  Please Note:  You might notice some irritation and congestion in your nose or some drainage.  This is from the oxygen used during your procedure.  There is no need for concern and it should clear up in a day or so.  SYMPTOMS TO REPORT IMMEDIATELY:  Following lower endoscopy (colonoscopy or flexible sigmoidoscopy):  Excessive amounts of blood in the stool  Significant tenderness or worsening of abdominal pains  Swelling of the abdomen that is new, acute  Fever of 100F or higher   For urgent or emergent issues, a gastroenterologist can be reached at any hour by calling [phone]. Do not use MyChart messaging for urgent concerns.    DIET:  We do recommend a small meal at first, but then you may proceed to your regular diet.  Drink plenty of fluids but you should avoid  alcoholic beverages for 24 hours.  ACTIVITY:  You should plan to take it easy for the rest of today and you should NOT DRIVE or use heavy machinery until tomorrow (because of the sedation medicines used during the test).    FOLLOW UP: Our staff will call the number listed on your records 48-72 hours following your procedure to check on you and address any questions or concerns that you may have regarding the information given to you following your procedure. If we do not reach you, we will leave a message.  We will attempt to reach you two times.  During this call, we will ask if you have developed any symptoms of COVID 19. If you develop any symptoms (ie: fever, flu-like symptoms, shortness of breath, cough etc.) before then, please call [phone].  If you test positive for Covid 19 in the 2 weeks post procedure, please call and report this information to [place].    If any biopsies were taken you will be contacted by phone or by letter within the next 1-3 weeks.  Please call us at [phone] if you have not heard about the biopsies in 3 weeks.    SIGNATURES/CONFIDENTIALITY: You and/or your care partner have signed paperwork which will be entered into your electronic medical record.  These signatures attest to the fact that that the information above on your After Visit Summary has been reviewed and is understood.  Full responsibility of the confidentiality of this discharge  information lies with you and/or your care-partner.

## 2020-12-08 NOTE — Progress Notes (Signed)
C.W. vital signs. 

## 2020-12-08 NOTE — Progress Notes (Signed)
A/ox3, pleased with MAC, report to RN 

## 2020-12-10 ENCOUNTER — Telehealth: Payer: Self-pay

## 2020-12-10 NOTE — Telephone Encounter (Signed)
Attempted to reach patient for post-procedure f/u call. No answer. Left message that we will make another attempt to reach him later today and for him to please not hesitate to call us if he has any questions/concerns regarding his care. 

## 2020-12-10 NOTE — Telephone Encounter (Signed)
Second attempt follow up call to pt, no answer.  

## 2020-12-15 ENCOUNTER — Encounter: Payer: Self-pay | Admitting: Internal Medicine

## 2021-01-07 IMAGING — MG DIGITAL SCREENING BILAT W/ TOMO W/ CAD
8 series · 8 of 24 positions shown · non-contrast
Comparison: Previous exam(s).

CLINICAL DATA: Screening.

EXAM:
DIGITAL SCREENING BILATERAL MAMMOGRAM WITH TOMO AND CAD

[R CC synth-2D]
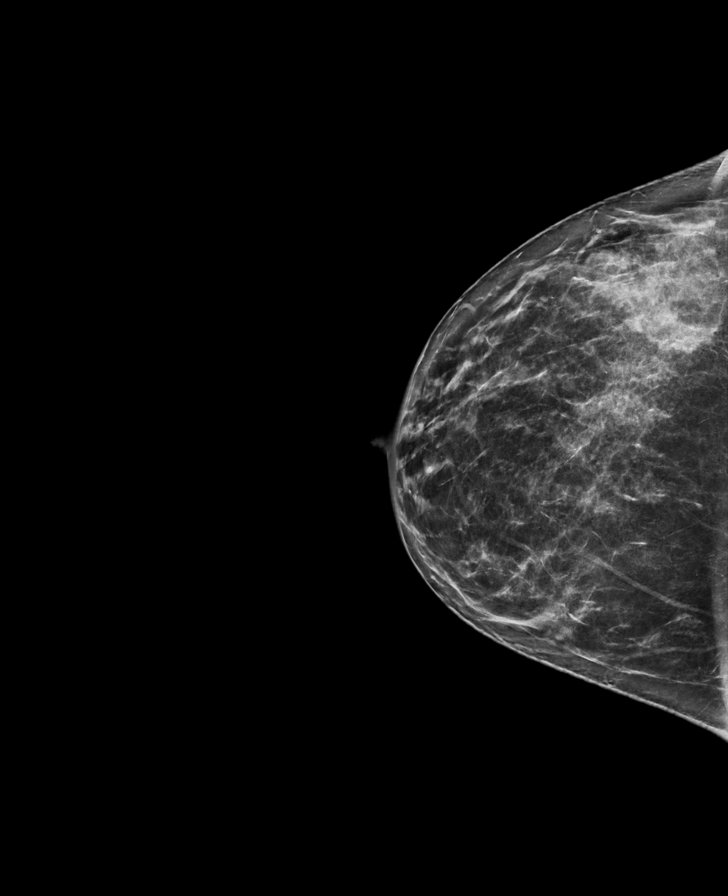

[L CC synth-2D]
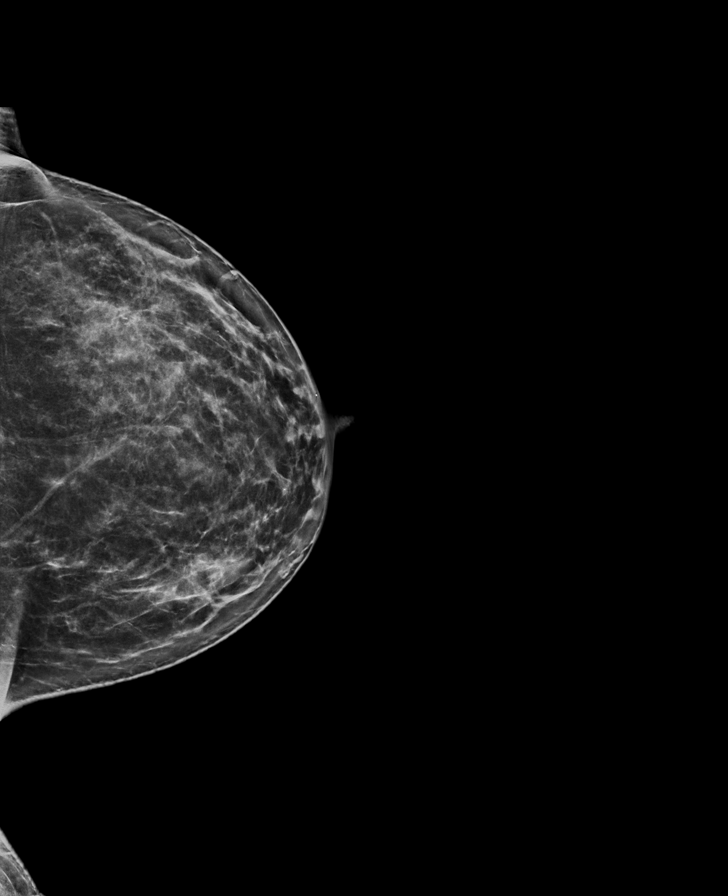

[L MLO synth-2D]
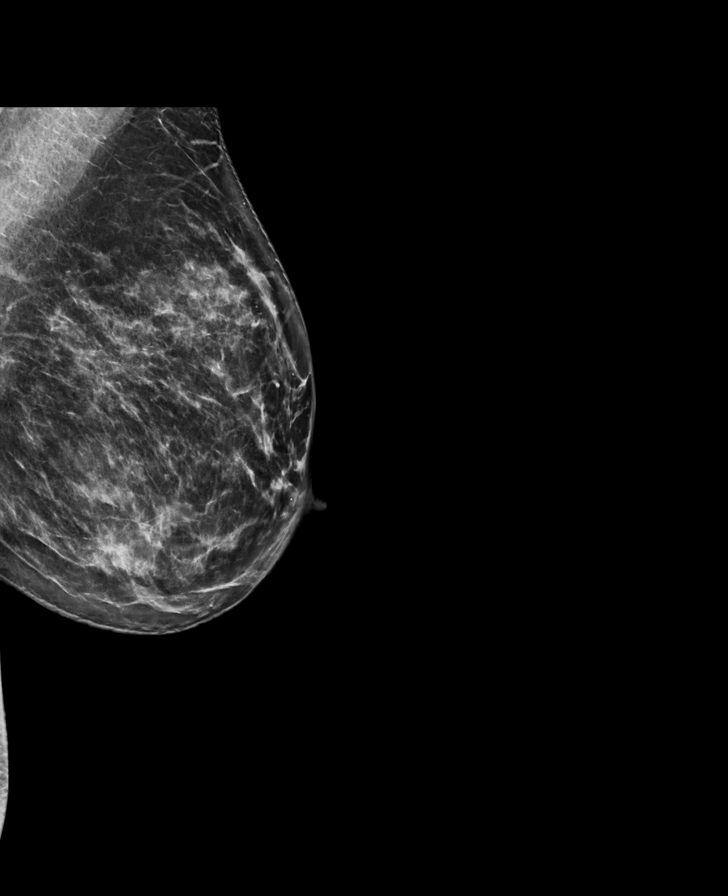

[R MLO synth-2D]
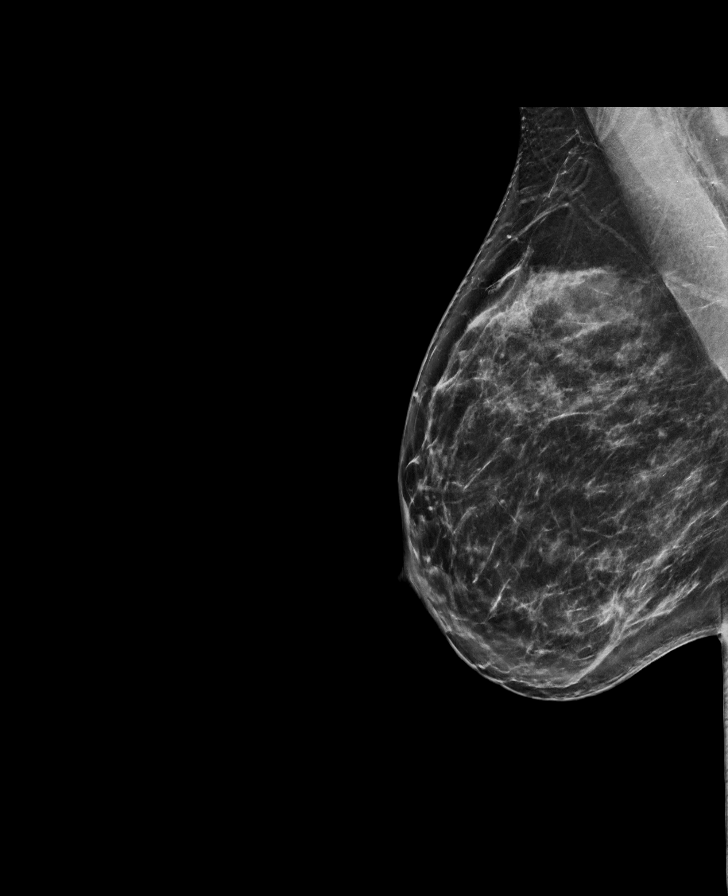

[R CC tomo · tomo slice 37/74.0]
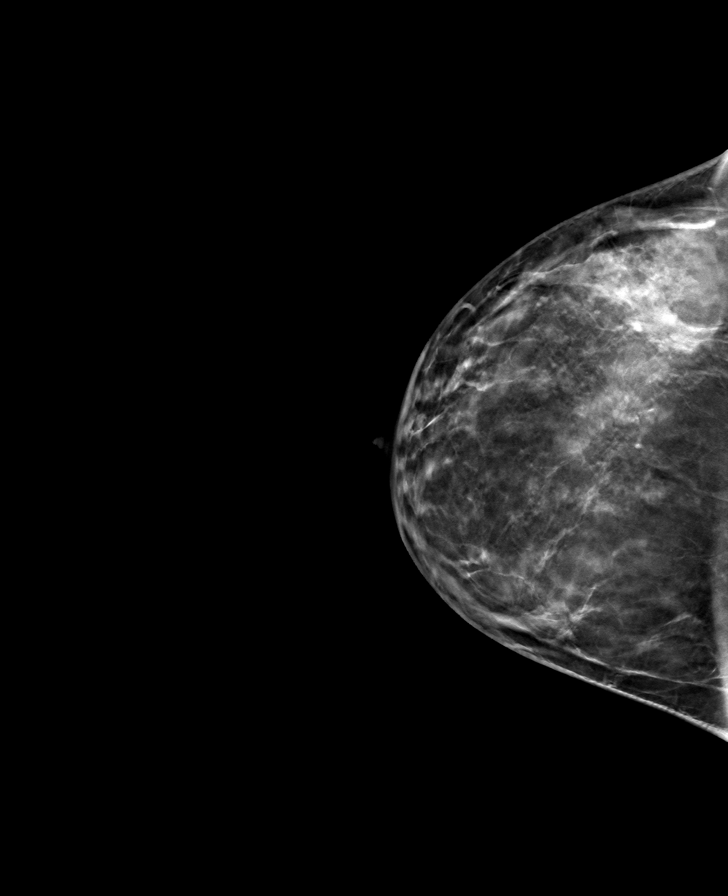

[L MLO tomo · tomo slice 41/81.0]
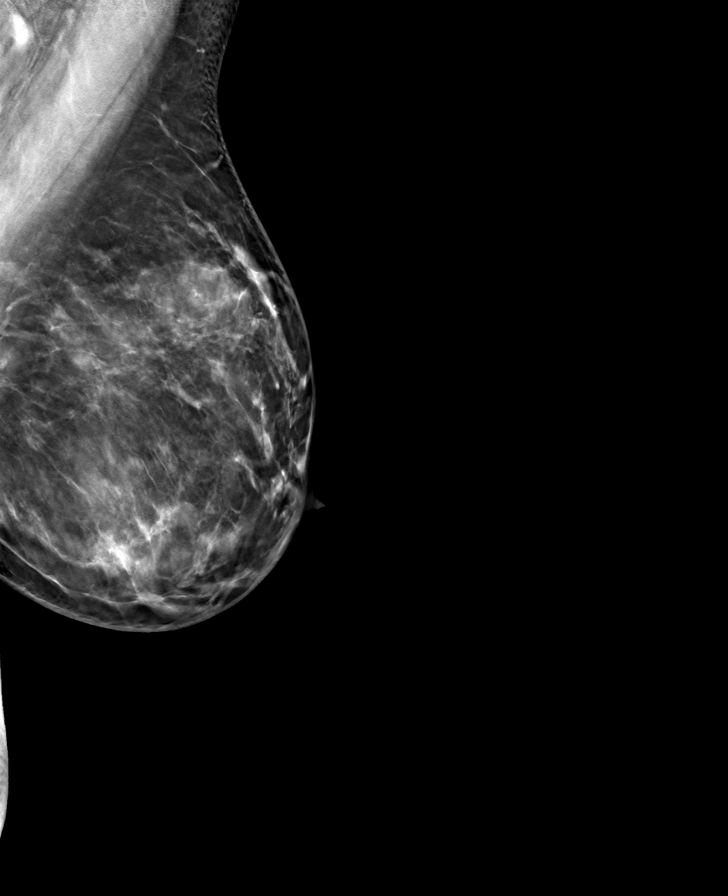

[L CC tomo · tomo slice 37/74.0]
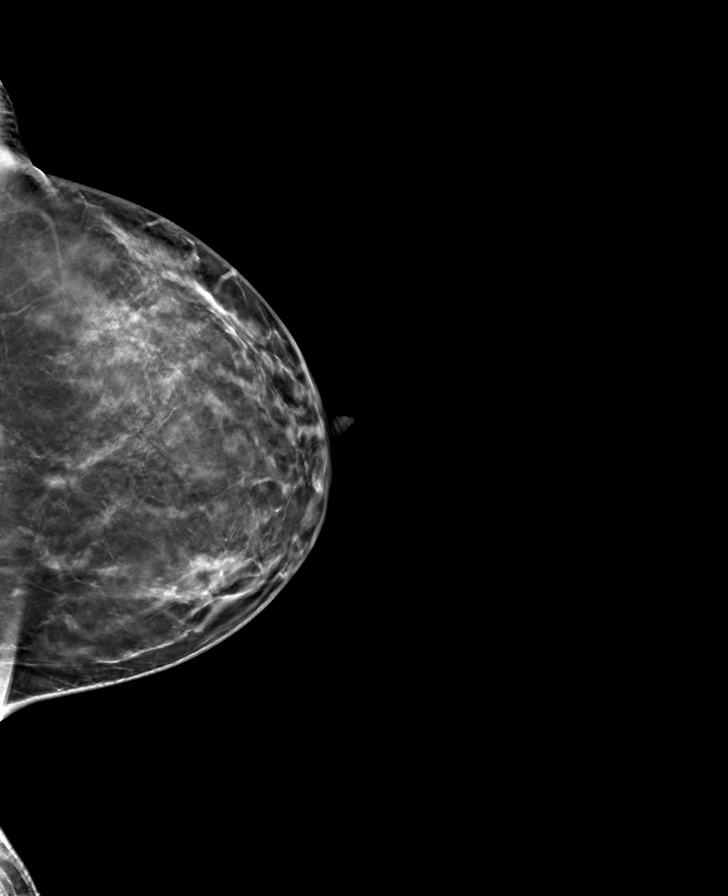

[R MLO tomo · tomo slice 43/85.0]
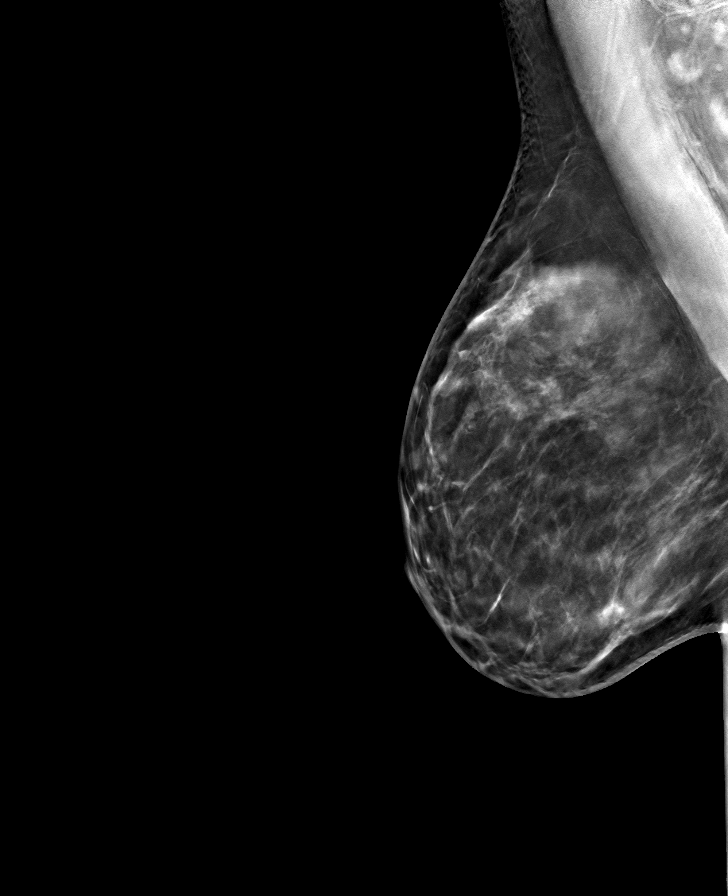

[8 of 24 positions shown; findings below may reference images not displayed]

ACR Breast Density Category b: There are scattered areas of
fibroglandular density.
FINDINGS: There are no findings suspicious for malignancy. Images were
processed with CAD.
IMPRESSION: No mammographic evidence of malignancy. A result letter of this
screening mammogram will be mailed directly to the patient.

RECOMMENDATION:
Screening mammogram in one year. (Code:[id number])

BI-RADS CATEGORY  1: Negative.

## 2021-02-01 ENCOUNTER — Other Ambulatory Visit: Payer: Self-pay | Admitting: Obstetrics and Gynecology

## 2021-02-01 DIAGNOSIS — Z1231 Encounter for screening mammogram for malignant neoplasm of breast: Secondary | ICD-10-CM

## 2021-03-22 DIAGNOSIS — Z1231 Encounter for screening mammogram for malignant neoplasm of breast: Secondary | ICD-10-CM

## 2022-02-28 ENCOUNTER — Ambulatory Visit: Payer: BC Managed Care – PPO | Admitting: Obstetrics and Gynecology

## 2022-07-11 NOTE — Progress Notes (Signed)
47 y.o. G47P1001 Married Caucasian female here for NEW GYN/fibroids.    Dealing with ongoing anemia and doing regular iron infusions.   Has known fibroids and heavy menses.   She has bleeding outside of her menstrual period.  She had an episode of heavy bleeding and clotting in August, 2023.  Also had bleeding for essentially the whole month of January, 2024 and into the beginning of February. No bleeding since the beginning of February.   She prefers to avoid hysterectomy if possible.   Declines future childbearing.   CT of abdomen and pelvis 02/16/22 through [facility] showing 2 suspected fibroids.  9 cm and 4 cm.   Prior pelvic US has shown the same.   Patient was on Aygestin 5 mg bid to control her menstrual bleeding in 2021.  Her sonohysterogram showed a 15 mm defect and endometrial biopsy showed a benign polyp.  She was scheduled for a hysteroscopy with polypectomy and surgery was cancelled by patient due to her family member with Covid.  Communication noted in [place].   PCP:   Judd Lien, MD  Patient's last menstrual period was 06/23/2022.     Period Duration (Days): 30 Period Pattern: (!) Irregular Menstrual Flow: Heavy     Sexually active: No.  The current method of family planning is condoms and withdrawal.  Exercising: No.     Smoker:  no  Health Maintenance: Pap:  09/10/14 neg History of abnormal Pap:  no MMG:  01/02/20 Breast Density Category B, BI-RADS CATEGORY 1 Neg Colonoscopy:  12/08/20 BMD:   n/a  Result  n/a TDaP:  unsure Gardasil:   no HIV: neg in preg Hep C: neg in preg Screening Labs:  PCP   reports that she has never smoked. She has never used smokeless tobacco. She reports current alcohol use. She reports that she does not use drugs.  Past Medical History:  Diagnosis Date   Allergic rhinitis    Anemia    04-29-19 patient having iron infusions--[facility]   Anxiety    Chronic headaches    COVID-19 04/2019   Diverticulosis    Elevated  prolactin level 09/19/2019   Essential hypertension    Fibroid    Ultrasound 02/12/19 - [facility] - left fundal 6.6 x 6.0 x 6.9 cm, left lateral 4.1 x 3.9 x 3.3 cm.    GERD (gastroesophageal reflux disease)    Heart murmur    SVT   Hx of adenomatous colonic polyps    IBS (irritable bowel syndrome)    Palpitations    Seasonal allergies    Sleep apnea    CPAP    Past Surgical History:  Procedure Laterality Date   COLONOSCOPY Bilateral 06/04/2015   WISDOM TOOTH EXTRACTION      Current Outpatient Medications  Medication Sig Dispense Refill   cetirizine (ZYRTEC) 10 MG tablet Take 10 mg by mouth daily.     clonazePAM (KLONOPIN) 0.5 MG tablet Take 0.5 mg by mouth 2 (two) times daily as needed.     cyanocobalamin (,VITAMIN B-12,) 1000 MCG/ML injection Inject into the muscle.     Cyanocobalamin (VITAMIN B-12 PO) Take by mouth.     cyclobenzaprine (FLEXERIL) 10 MG tablet Take 10 mg by mouth 3 (three) times daily as needed.      metoprolol succinate (TOPROL-XL) 50 MG 24 hr tablet Take 50 mg by mouth daily.     Current Facility-Administered Medications  Medication Dose Route Frequency Provider Last Rate Last Admin  0.9 %  sodium chloride infusion  500 mL Intravenous Once Irene Shipper, MD        Family History  Problem Relation Age of Onset   Hypertension Mother    CAD Mother        CABG at age 73   Colon polyps Mother 18       pre-cancerous   Polycystic kidney disease Mother    Diabetes Maternal Grandmother    Stroke Maternal Grandmother    Colon cancer Maternal Grandmother        in her 52's   CAD Father        CABG in his late 30s   Ovarian cancer Maternal Aunt    Throat cancer Maternal Uncle    Pancreatic cancer Maternal Aunt    Scoliosis Daughter    Healthy Daughter    Esophageal cancer Neg Hx    Rectal cancer Neg Hx    Stomach cancer Neg Hx     Review of Systems  Genitourinary:  Positive for vaginal bleeding.    Exam:   BP 124/82 (BP  Location: Right Arm, Patient Position: Sitting, Cuff Size: Normal)   Pulse 74   Ht '5\' 7"'$  (1.702 m)   Wt 194 lb (88 kg)   LMP 06/23/2022   SpO2 96%   BMI 30.38 kg/m     General appearance: alert, cooperative and appears stated age Head: normocephalic, without obvious abnormality, atraumatic Neck: no adenopathy, supple, symmetrical, trachea midline and thyroid normal to inspection and palpation Lungs: clear to auscultation bilaterally Breasts: normal appearance, no masses or tenderness, No nipple retraction or dimpling, No nipple discharge or bleeding, No axillary adenopathy Heart: regular rate and rhythm Abdomen: soft, non-tender; mass to 3 cm above pubic symphysis.  Extremities: extremities normal, atraumatic, no cyanosis or edema Skin: skin color, texture, turgor normal. No rashes or lesions Lymph nodes: cervical, supraclavicular, and axillary nodes normal. Neurologic: grossly normal  Pelvic: External genitalia:  no lesions              No abnormal inguinal nodes palpated.              Urethra:  normal appearing urethra with no masses, tenderness or lesions              Bartholins and Skenes: normal                 Vagina: normal appearing vagina with normal color and discharge, no lesions              Cervix: no lesions              Pap taken: yes Bimanual Exam:  Uterus:  12 week size uterus, more left sided than right sided.              Adnexa: no mass, fullness, tenderness              Rectal exam: yes.  Confirms.              Anus:  normal sphincter tone, no lesions  Chaperone was present for exam:  Kimalexis.  Assessment:   Well woman visit with gynecologic exam. Fibroids. 2 large fibroids.  12 week size uterus. Hx endometrial polyp. Abnormal uterine bleeding.  Iron deficiency anemia.  Chronic.  Cervical cancer screening.  Hx elevate prolactin. HTN.   Plan: Mammogram screening discussed.  She will schedule this.  Self breast awareness reviewed. Pap and HR HPV  collected.  Guidelines for  Calcium, Vitamin D, regular exercise program including cardiovascular and weight bearing exercise. We discussed treatment options:  Myfemb[place], uterine artery embolization, radiofrequency treatment of fibroids Adah Perl and [facility]), and laparoscopic hysterectomy.  Return for pelvic US and endometrial biopsy.  Will check prolactin level at that office visit.  Follow up annually and prn.   After visit summary provided.   20  total time was spent for this patient encounter, including preparation, face-to-face counseling with the patient, coordination of care, and documentation of the encounter due to fibroids and anemia in addition to doing the annual exam.

## 2022-07-25 ENCOUNTER — Ambulatory Visit: Payer: BC Managed Care – PPO | Admitting: Obstetrics and Gynecology

## 2022-07-25 ENCOUNTER — Other Ambulatory Visit: Payer: Self-pay | Admitting: Obstetrics and Gynecology

## 2022-07-25 ENCOUNTER — Other Ambulatory Visit (HOSPITAL_COMMUNITY)
Admission: RE | Admit: 2022-07-25 | Discharge: 2022-07-25 | Disposition: A | Discharge status: Home / Self Care | Payer: BC Managed Care – PPO | Source: Ambulatory Visit | Attending: Obstetrics and Gynecology | Admitting: Obstetrics and Gynecology

## 2022-07-25 ENCOUNTER — Encounter: Payer: Self-pay | Admitting: Obstetrics and Gynecology

## 2022-07-25 VITALS — BP 124/82 | HR 74 | Ht 67.0 in | Wt 194.0 lb

## 2022-07-25 DIAGNOSIS — D219 Benign neoplasm of connective and other soft tissue, unspecified: Secondary | ICD-10-CM

## 2022-07-25 DIAGNOSIS — Z01419 Encounter for gynecological examination (general) (routine) without abnormal findings: Secondary | ICD-10-CM | POA: Diagnosis not present

## 2022-07-25 DIAGNOSIS — N921 Excessive and frequent menstruation with irregular cycle: Secondary | ICD-10-CM

## 2022-07-25 DIAGNOSIS — Z124 Encounter for screening for malignant neoplasm of cervix: Secondary | ICD-10-CM | POA: Diagnosis present

## 2022-07-25 NOTE — Patient Instructions (Addendum)
EXERCISE AND DIET:  We recommended that you start or continue a regular exercise program for good health. Regular exercise means any activity that makes your heart beat faster and makes you sweat.  We recommend exercising at least 30 minutes per day at least 3 days a week, preferably 4 or 5.  We also recommend a diet low in fat and sugar.  Inactivity, poor dietary choices and obesity can cause diabetes, heart attack, stroke, and kidney damage, among others.    ALCOHOL AND SMOKING:  Women should limit their alcohol intake to no more than 7 drinks/beers/glasses of wine (combined, not each!) per week. Moderation of alcohol intake to this level decreases your risk of breast cancer and liver damage. And of course, no recreational drugs are part of a healthy lifestyle.  And absolutely no smoking or even second hand smoke. Most people know smoking can cause heart and lung diseases, but did you know it also contributes to weakening of your bones? Aging of your skin?  Yellowing of your teeth and nails?  CALCIUM AND VITAMIN D:  Adequate intake of calcium and Vitamin D are recommended.  The recommendations for exact amounts of these supplements seem to change often, but generally speaking 600 mg of calcium (either carbonate or citrate) and 800 units of Vitamin D per day seems prudent. Certain women may benefit from higher intake of Vitamin D.  If you are among these women, your doctor will have told you during your visit.    PAP SMEARS:  Pap smears, to check for cervical cancer or precancers,  have traditionally been done yearly, although recent scientific advances have shown that most women can have pap smears less often.  However, every woman still should have a physical exam from her gynecologist every year. It will include a breast check, inspection of the vulva and vagina to check for abnormal growths or skin changes, a visual exam of the cervix, and then an exam to evaluate the size and shape of the uterus and  ovaries.  And after 46 years of age, a rectal exam is indicated to check for rectal cancers. We will also provide age appropriate advice regarding health maintenance, like when you should have certain vaccines, screening for sexually transmitted diseases, bone density testing, colonoscopy, mammograms, etc.   MAMMOGRAMS:  All women over 40 years old should have a yearly mammogram. Many facilities now offer a "3D" mammogram, which may cost around $50 extra out of pocket. If possible,  we recommend you accept the option to have the 3D mammogram performed.  It both reduces the number of women who will be called back for extra views which then turn out to be normal, and it is better than the routine mammogram at detecting truly abnormal areas.    COLONOSCOPY:  Colonoscopy to screen for colon cancer is recommended for all women at age 50.  We know, you hate the idea of the prep.  We agree, BUT, having colon cancer and not knowing it is worse!!  Colon cancer so often starts as a polyp that can be seen and removed at colonscopy, which can quite literally save your life!  And if your first colonoscopy is normal and you have no family history of colon cancer, most women don't have to have it again for 10 years.  Once every ten years, you can do something that may end up saving your life, right?  We will be happy to help you get it scheduled when you are ready.    Be sure to check your insurance coverage so you understand how much it will cost.  It may be covered as a preventative service at no cost, but you should check your particular policy.    Relugolix; Estradiol; Norethindrone Tablets What is this medication? RELUGOLIX; ESTRADIOL; NORETHINDRONE (rel loo GOE lix; es tra DYE ole; nor eth IN drone) helps reduce heavy periods caused by uterine fibroids. It may also be used to treat pain from endometriosis. Relugolix works by decreasing the amount of estrogen and other hormones your body makes, which reduces heavy  bleeding and pain. Estradiol lowers the risk of bone loss caused by relugolix. Norethindrone helps lower the risk of cancer that can be caused by estradiol. This medication contains the hormones estrogen and progestin. This medicine may be used for other purposes; ask your health care provider or pharmacist if you have questions. COMMON BRAND NAME(S): Myfembree What should I tell my care team before I take this medication? They need to know if you have any of these conditions: Blood clots Breast, cervical, endometrial, or uterine cancer Diabetes Gallbladder disease Heart disease High blood pressure High cholesterol Kidney disease Liver disease Lupus Mental health condition Migraine headaches Osteoporosis, weak bones Porphyria Stroke Suicidal thoughts, plans, or attempt by you or a family member Tobacco use Unexplained vaginal bleeding An unusual or allergic reaction to relugolix, estrogens, progestins, other medications, foods, dyes, or preservatives Pregnant or trying to get pregnant Breastfeeding How should I use this medication? Take this medication by mouth with water. Take it as directed on the prescription label at the same time every day. You can take it with or without food. If it upsets your stomach, take it with food. Keep taking it unless your care team tells you to stop. A special MedGuide will be given to you by the pharmacist with each prescription and refill. Be sure to read this information carefully each time. Talk to your care team about the use of this medication in children. It is not approved for use in children. Overdosage: If you think you have taken too much of this medicine contact a poison control center or emergency room at once. NOTE: This medicine is only for you. Do not share this medicine with others. What if I miss a dose? If you miss a dose, take it as soon as you can. If it is almost time for your next dose, take only that dose. Do not take double or  extra doses. What may interact with this medication? Do not take this medication with any of the following: Aromatase inhibitors, such as aminoglutethimide, anastrozole, exemestane, letrozole, testolactone Cisapride Dronedarone Elagolix Pimozide Thioridazine This medication may also interact with the following: Certain antibiotics, such as erythromycin, clarithromycin, telithromycin Certain antivirals for HIV or hepatitis Certain medications for cancer treatment Certain medications for fungal infections, such as ketoconazole, itraconazole, posaconazole Certain medications for seizures, such as carbamazepine, phenobarbital, phenytoin Corticosteroids, such as hydrocortisone, prednisone, prednisolone Cyclosporine Grapefruit juice Medications for diabetes Mifepristone Other medications that cause heart rhythm changes Raloxifene Rifampin St. John's wort Tamoxifen Thyroid hormones Tranexamic acid Tricyclic antidepressants Verapamil Warfarin This list may not describe all possible interactions. Give your health care provider a list of all the medicines, herbs, non-prescription drugs, or dietary supplements you use. Also tell them if you smoke, drink alcohol, or use illegal drugs. Some items may interact with your medicine. What should I watch for while using this medication? Visit your care team for regular checks on your progress. You will need  a regular breast and pelvic exam while on this medication. It may take several months to see improvement in your condition. You may have a change in bleeding pattern, irregular periods, or may stop having periods while taking this medication. Talk with your care team if you may be pregnant. Serious birth defects can occur if you take this medication during pregnancy and for 1 week after the last dose. Contraception is recommended while taking this medication and for 1 week after the last dose. Your care team can help you find the option that works  for you. Talk to your care team if you use tobacco products. Changes to your treatment plan may be needed. Tobacco increases the risk of getting a blood clot or having a stroke while taking this medication. The risk is higher if you are over the age of 11. This medication can make your body retain fluid, making your fingers, hands, or ankles swell. Your blood pressure can go up. Contact your care team if you feel you are retaining fluid. Using this medication for a long time may weaken your bones. The risk of bone fractures may be increased. Talk to your care team about your bone health. If you are going to need surgery or other procedure, tell your care team that you are using this medication. What side effects may I notice from receiving this medication? Side effects that you should report to your care team as soon as possible: Allergic reactions--skin rash, itching, hives, swelling of the face, lips, tongue, or throat Blood clot--pain, swelling, or warmth in the leg, shortness of breath, chest pain Gallbladder problems--severe stomach pain, nausea, vomiting, fever Heavy vaginal bleeding Increase in blood pressure Liver injury--right upper belly pain, loss of appetite, nausea, light-colored stool, dark yellow or brown urine, yellowing skin or eyes, unusual weakness or fatigue Stroke--sudden numbness or weakness of the face, arm, or leg, trouble speaking, confusion, trouble walking, loss of balance or coordination, dizziness, severe headache, change in vision Thoughts of suicide or self-harm, worsening mood, or feelings of depression Side effects that usually do not require medical attention (report these to your care team if they continue or are bothersome): Change in sex drive or performance Dizziness Fatigue Hair loss Headache Hot flashes This list may not describe all possible side effects. Call your doctor for medical advice about side effects. You may report side effects to FDA at  1-800-FDA-1088. Where should I keep my medication? Keep out of the reach of children and pets. Store between 15 and 30 degrees C (59 and 86 degrees F). Get rid of any unused medication after the expiration date. To get rid of medications that are no longer needed or have expired: Take the medication to a medication take-back program. Check with your pharmacy or law enforcement to find a location. If you cannot return the medication, check the label or package insert to see if the medication should be thrown out in the garbage or flushed down the toilet. If you are not sure, ask your care team. If it is safe to put it in the trash, empty the medication out of the container. Mix the medication with cat litter, dirt, coffee grounds, or other unwanted substance. Seal the mixture in a bag or container. Put it in the trash. NOTE: This sheet is a summary. It may not cover all possible information. If you have questions about this medicine, talk to your doctor, pharmacist, or health care provider.  2023 Elsevier/Gold Standard (2021-10-08 00:00:00)

## 2022-07-25 NOTE — Progress Notes (Signed)
Order for pelvic US and endometrial biopsy.

## 2022-08-02 LAB — CYTOLOGY - PAP
Adequacy: ABSENT
Comment: NEGATIVE
Diagnosis: NEGATIVE
High risk HPV: NEGATIVE

## 2022-08-25 NOTE — Progress Notes (Signed)
GYNECOLOGY  VISIT   HPI: 47 y.o.   Married  Caucasian  female   [id number] with Patient's last menstrual period was 06/23/2022.   here for   post U/S endometrial biopsy for heavy and history of irregular menstrual bleeding.  No bleeding at all since 06/28/22.  Has known fibroids and anemia. Anemia may be multifactorial.  Uterine filling defect seen on sonohysterogram 10/24/19. EMB showed polyp.  UPT negative.  GYNECOLOGIC HISTORY: Patient's last menstrual period was 06/23/2022. Contraception:  abstinence Menopausal hormone therapy:  n/a Last mammogram:  01/02/20 Breast Density Category B, BI-RADS CAT 1 neg Last pap smear:   07/25/22 neg: HR HPV neg        OB History     Gravida  1   Para  1   Term  1   Preterm  0   AB  0   Living  1      SAB  0   IAB  0   Ectopic  0   Multiple  0   Live Births  1              Patient Active Problem List   Diagnosis Date Noted   Colon polyp 02/07/2019   Dysfunctional uterine bleeding 02/06/2019   Vitamin B12 deficiency anemia, unspecified 05/01/2018   Muscle spasm 04/10/2018   Iron deficiency anemia, unspecified 02/22/2015   Cholecystitis 12/31/2013   Disequilibrium 12/31/2013   Palpitations 11/05/2013   Elevated blood pressure 11/05/2013   Family history of premature CAD 11/05/2013   Anxiety state 03/29/2011    Past Medical History:  Diagnosis Date   Allergic rhinitis    Anemia    04-29-19 patient having iron infusions--[facility]   Anxiety    Chronic headaches    COVID-19 04/2019   Diverticulosis    Elevated prolactin level 09/19/2019   Essential hypertension    Fibroid    Ultrasound 02/12/19 [facility] - left fundal 6.6 x 6.0 x 6.9 cm, left lateral 4.1 x 3.9 x 3.3 cm.    GERD (gastroesophageal reflux disease)    Heart murmur    SVT   Hx of adenomatous colonic polyps    IBS (irritable bowel syndrome)    Palpitations    Seasonal allergies    Sleep apnea    CPAP    Past Surgical History:   Procedure Laterality Date   COLONOSCOPY Bilateral 06/04/2015   WISDOM TOOTH EXTRACTION      Current Outpatient Medications  Medication Sig Dispense Refill   cetirizine (ZYRTEC) 10 MG tablet Take 10 mg by mouth daily.     clonazePAM (KLONOPIN) 0.5 MG tablet Take 0.5 mg by mouth 2 (two) times daily as needed.     cyanocobalamin (,VITAMIN B-12,) 1000 MCG/ML injection Inject into the muscle.     Cyanocobalamin (VITAMIN B-12 PO) Take by mouth.     cyclobenzaprine (FLEXERIL) 10 MG tablet Take 10 mg by mouth 3 (three) times daily as needed.      metoprolol succinate (TOPROL-XL) 50 MG 24 hr tablet Take 50 mg by mouth daily.     Current Facility-Administered Medications  Medication Dose Route Frequency Provider Last Rate Last Admin   0.9 %  sodium chloride infusion  500 mL Intravenous Once Hilarie Fredrickson, MD         ALLERGIES: Iron dextran, Biaxin [clarithromycin], Cymbalta [duloxetine hcl], Erythromycin, Haemophilus b polysaccharide vaccine, Influenza virus vaccine, Lexapro [escitalopram oxalate], Macrobid [nitrofurantoin], Penicillins, and Sulfa antibiotics  Family History  Problem Relation Age of Onset   Hypertension Mother    CAD Mother        CABG at age 9   Colon polyps Mother 53       pre-cancerous   Polycystic kidney disease Mother    Diabetes Maternal Grandmother    Stroke Maternal Grandmother    Colon cancer Maternal Grandmother        in her 60's   CAD Father        CABG in his late 70s   Ovarian cancer Maternal Aunt    Throat cancer Maternal Uncle    Pancreatic cancer Maternal Aunt    Scoliosis Daughter    Healthy Daughter    Esophageal cancer Neg Hx    Rectal cancer Neg Hx    Stomach cancer Neg Hx     Social History   Socioeconomic History   Marital status: Married    Spouse name: Not on file   Number of children: 1   Years of education: some college   Highest education level: Not on file  Occupational History   Occupation: Diplomatic Services operational officer  Tobacco Use    Smoking status: Never   Smokeless tobacco: Never  Vaping Use   Vaping Use: Never used  Substance and Sexual Activity   Alcohol use: Yes    Comment: Occasional   Drug use: No   Sexual activity: Not Currently  Other Topics Concern   Not on file  Social History Narrative   Lives at home with her husband.   Right-handed.   1 cup caffeine per day.   Social Determinants of Health   Financial Resource Strain: Not on file  Food Insecurity: Not on file  Transportation Needs: Not on file  Physical Activity: Not on file  Stress: Not on file  Social Connections: Not on file  Intimate Partner Violence: Not on file    Review of Systems  All other systems reviewed and are negative.   PHYSICAL EXAMINATION:    BP 122/84 (BP Location: Left Arm, Patient Position: Sitting, Cuff Size: Normal)   Pulse 75   Ht 5\' 7"  (1.702 m)   Wt 198 lb (89.8 kg)   LMP 06/23/2022   SpO2 97%   BMI 31.01 kg/m     General appearance: alert, cooperative and appears stated age  Pelvic US Uterus 15.24 x 10.43 x 9.4 cm.  2 fibroids:  left lateral - 9.02 cm, left lower uterine segment pedunculated - 5.28 cm.  Both increased in size sine [place] in 2021.  EMS 14 mm. 18 mm polypoid defect noted.  Left ovary 2.96 x 2.6 x 2.17 cm.  Right ovary 3.73 x 3.07 x 2.18 cm.  1.90 cm simple avascular cyst noted.  No adnexal masses.  No free fluid.   EMB Consent done.  Hibiclens prep. Paracervical block with 10 cc 1% lidocaine.  Lot [id number], exp 11/2024. Tenaculum to anterior cervical lip. Endometrial biopsy to 10 cm x 2.  Tissue to pathology.  No complications.  Minimal EBL.  Chaperone was present for exam:  Warren Lacy, CMA  ASSESSMENT  Menopausal symptoms.  History of menorrhagia.  No current bleeding. Large fibroids, one is pedunculated. Endometrial thickening.  Hx endometrial polyp. Elevated prolactin.  Anemia, multiple etiologies.    PLAN  Pelvic ultrasound images and report reviewed.  FU EMB  result. FSH, estradiol, prolactin.  We discussed treatment options for fibroids: hysterectomy, uterine artery embolization (may not be an option due to pedunculated fibroid), Sonata, and Myfembree.  Written information provided. If patient is menopausal, reduction in the size of the fibroids is likely with time.  Fu in 10 - 14 days.   30 min  total time was spent for this patient encounter, including preparation, face-to-face counseling with the patient, coordination of care, and documentation of the encounter in addition to doing the endometrial biopsy.

## 2022-09-08 ENCOUNTER — Ambulatory Visit (INDEPENDENT_AMBULATORY_CARE_PROVIDER_SITE_OTHER): Payer: BC Managed Care – PPO

## 2022-09-08 ENCOUNTER — Other Ambulatory Visit (HOSPITAL_COMMUNITY)
Admission: RE | Admit: 2022-09-08 | Discharge: 2022-09-08 | Disposition: A | Discharge status: Home / Self Care | Payer: BC Managed Care – PPO | Source: Ambulatory Visit | Attending: Obstetrics and Gynecology | Admitting: Obstetrics and Gynecology

## 2022-09-08 ENCOUNTER — Ambulatory Visit (INDEPENDENT_AMBULATORY_CARE_PROVIDER_SITE_OTHER): Payer: BC Managed Care – PPO | Admitting: Obstetrics and Gynecology

## 2022-09-08 ENCOUNTER — Encounter: Payer: Self-pay | Admitting: Obstetrics and Gynecology

## 2022-09-08 VITALS — BP 122/84 | HR 75 | Ht 67.0 in | Wt 198.0 lb

## 2022-09-08 DIAGNOSIS — N951 Menopausal and female climacteric states: Secondary | ICD-10-CM

## 2022-09-08 DIAGNOSIS — R9389 Abnormal findings on diagnostic imaging of other specified body structures: Secondary | ICD-10-CM

## 2022-09-08 DIAGNOSIS — Z01812 Encounter for preprocedural laboratory examination: Secondary | ICD-10-CM

## 2022-09-08 DIAGNOSIS — Z8742 Personal history of other diseases of the female genital tract: Secondary | ICD-10-CM

## 2022-09-08 DIAGNOSIS — N921 Excessive and frequent menstruation with irregular cycle: Secondary | ICD-10-CM | POA: Diagnosis not present

## 2022-09-08 DIAGNOSIS — D219 Benign neoplasm of connective and other soft tissue, unspecified: Secondary | ICD-10-CM

## 2022-09-08 DIAGNOSIS — R7989 Other specified abnormal findings of blood chemistry: Secondary | ICD-10-CM

## 2022-09-08 LAB — PREGNANCY, URINE: Preg Test, Ur: NEGATIVE

## 2022-09-08 LAB — ESTRADIOL: Estradiol: 81 pg/mL

## 2022-09-08 NOTE — Patient Instructions (Addendum)
Total Laparoscopic Hysterectomy A total laparoscopic hysterectomy is a minimally invasive surgery to remove the uterus and cervix. The fallopian tubes and ovaries can also be removed during this surgery, if necessary. This procedure may be done to treat problems such as: Growths in the uterus (uterine fibroids) that are not cancer but cause symptoms. A condition that causes the lining of the uterus to grow in other areas (endometriosis). Problems with pelvic support. Cancer of the cervix, ovaries, uterus, or tissue that lines the uterus (endometrium). Excessive bleeding in the uterus. After this procedure, you will no longer be able to have a baby, and you will no longer have a menstrual period. Tell a health care provider about: Any allergies you have. All medicines you are taking, including vitamins, herbs, eye drops, creams, and over-the-counter medicines. Any problems you or family members have had with anesthetic medicines. Any blood disorders you have. Any surgeries you have had. Any medical conditions you have. Whether you are pregnant or may be pregnant. What are the risks? Generally, this is a safe procedure. However, problems may occur, including: Infection. Bleeding. Blood clots in the legs or lungs. Allergic reactions to medicines. Damage to nearby structures or organs. Having to change from this surgery to one in which a large incision is made in the abdomen (abdominal hysterectomy). What happens before the procedure? Staying hydrated Follow instructions from your health care provider about hydration, which may include: Up to 2 hours before the procedure - you may continue to drink clear liquids, such as water, clear fruit juice, black coffee, and plain tea.  Eating and drinking restrictions Follow instructions from your health care provider about eating and drinking, which may include: 8 hours before the procedure - stop eating heavy meals or foods, such as meat, fried  foods, or fatty foods. 6 hours before the procedure - stop eating light meals or foods, such as toast or cereal. 6 hours before the procedure - stop drinking milk or drinks that contain milk. 2 hours before the procedure - stop drinking clear liquids. Medicines Take over-the-counter and prescription medicines only as told by your health care provider. You may be asked to take medicine that helps you have a bowel movement (laxative) to prevent constipation. General instructions If you were asked to do bowel preparation before the procedure, follow instructions from your health care provider. This procedure can affect the way you feel about yourself. Talk with your health care provider about the physical and emotional changes hysterectomy may cause. Do not use any products that contain nicotine or tobacco for at least 4 weeks before the procedure. These products include cigarettes, chewing tobacco, and vaping devices, such as e-cigarettes. If you need help quitting, ask your health care provider. Plan to have a responsible adult take you home from the hospital or clinic. Plan to have a responsible adult care for you for the time you are told after you leave the hospital or clinic. This is important. Surgery safety Ask your health care provider: How your surgery site will be marked. What steps will be taken to help prevent infection. These may include: Removing hair at the surgery site. Washing skin with a germ-killing soap. Receiving antibiotic medicine. What happens during the procedure? An IV will be inserted into one of your veins. You will be given one or more of the following: A medicine to help you relax (sedative). A medicine to make you fall asleep (general anesthetic). A medicine to numb the area (local anesthetic). A medicine  that is injected into your spine to numb the area below and slightly above the injection site (spinal anesthetic). A medicine that is injected into an area of  your body to numb everything below the injection site (regional anesthetic). A gas will be used to inflate your abdomen. This will allow your surgeon to look inside your abdomen and do the surgery. Three or four small incisions will be made in your abdomen. A small device with a light (laparoscope) will be inserted into one of your incisions. Surgical instruments will be inserted through the other incisions in order to perform the procedure. Your uterus and cervix may be removed through your vagina or cut into small pieces and removed through the small incisions. Any other organs that need to be removed will also be removed this way. The gas will be released from inside your abdomen. Your incisions will be closed with stitches (sutures), skin glue, or adhesive strips. A bandage (dressing) may be placed over your incisions. The procedure may vary among health care providers and hospitals. What happens after the procedure? Your blood pressure, heart rate, breathing rate, and blood oxygen level will be monitored until you leave the hospital or clinic. You will be given medicine for pain as needed. You will be encouraged to walk as soon as possible. You will also use a device to help you breathe or do breathing exercises to keep your lungs clear. You may have to wear compression stockings. These stockings help to prevent blood clots and reduce swelling in your legs. You will need to wear a sanitary pad for vaginal discharge or bleeding. Summary Total laparoscopic hysterectomy is a procedure to remove your uterus, cervix, and sometimes the fallopian tubes and ovaries. This procedure can affect the way you feel about yourself. Talk with your health care provider about the physical and emotional changes hysterectomy may cause. After this procedure, you will no longer be able to have a baby, and you will no longer have a menstrual period. You will be given pain medicine to control discomfort after this  procedure. Plan to have a responsible adult take you home from the hospital or clinic. This information is not intended to replace advice given to you by your health care provider. Make sure you discuss any questions you have with your health care provider. Document Revised: 07/21/2021 Document Reviewed: 01/10/2020 Elsevier Patient Education  2023 [employer]   Uterine Artery Embolization for Fibroids Uterine artery embolization is a procedure to shrink uterine fibroids. Uterine fibroids are abnormal growths of tissue (tumors) that can develop in the uterus and cause heavy menstrual bleeding and pain. This type of tumor is not cancerous (is benign). Fibroids can vary in size, shape, weight, and where they grow in the uterus. In this procedure, a small, thin tube (catheter) is used to inject tiny particle beads that block the blood supply to the fibroid. This causes the fibroid to shrink. Tell a health care provider about: Any allergies you have. All medicines you are taking, including vitamins, herbs, eye drops, creams, and over-the-counter medicines. Any problems you or family members have had with anesthetic medicines. Any blood disorders you have. Any surgeries you have had. Any medical conditions you have. Whether you are pregnant or may be pregnant. What are the risks? Generally, this is a safe procedure. However, problems may occur, including: Bleeding. Allergic reactions to medicines or dyes. Damage to nearby structures or organs. Infection, including blood infection. Injury to the uterus from decreased blood supply.  Lack of menstrual periods. Other problems may occur, including: Death of tissue cells around your bladder or vulva. A hole that may develop between organs, or from an organ to the surface of your skin. Blood clot in the legs or lung. What happens before the procedure? Staying hydrated Follow instructions from your health care provider about hydration, which may  include: Up to 2 hours before the procedure - you may continue to drink clear liquids, such as water, clear fruit juice, black coffee, and plain tea.  Eating and drinking restrictions Follow instructions from your health care provider about eating and drinking, which may include: 8 hours before the procedure - stop eating heavy meals or foods, such as meat, fried foods, or fatty foods. 6 hours before the procedure - stop eating light meals or foods, such as toast or cereal. 6 hours before the procedure - stop drinking milk or drinks that contain milk. 2 hours before the procedure - stop drinking clear liquids. Medicines Ask your health care provider about: Changing or stopping your regular medicines. This is especially important if you are taking diabetes medicines or blood thinners. Taking over-the-counter medicines, vitamins, herbs, and supplements. Taking medicines such as aspirin and ibuprofen. These medicines can thin your blood. Do not take these medicines unless your health care provider tells you to take them. You may be given medicine to prevent nausea and vomiting. General instructions Do not use any products that contain nicotine or tobacco for at least 4 weeks before the procedure. These products include cigarettes, chewing tobacco, and vaping devices, such as e-cigarettes. If you need help quitting, ask your health care provider. Ask your health care provider: What steps will be taken to help prevent infection. These steps may include: Removing hair at the surgery site. Washing skin with a germ-killing soap. Taking antibiotic medicine. You will be asked to empty your bladder before the procedure. Plan to have a responsible adult take you home from the hospital or clinic. Plan to have a responsible adult care for you for the time you are told after you leave the hospital or clinic. This is important. What happens during the procedure?  An IV will be inserted into one of your  veins. You will be given one or more of the following: A medicine to help you relax (sedative). A medicine to numb the area (local anesthetic). A small cut (incision) will be made in your groin to find the main artery in your leg. A catheter will be inserted into the main artery in your leg and guided to your uterus. Images and X-rays will be taken while dye is injected through the catheter. This shows the blood supply to your uterus and fibroids. Tiny particles, about the size of grains of sand, will be injected through the catheter and will lodge in tiny branches of the uterine artery. This will block the blood supply to the fibroids and cause them to shrink. Metal coils may also be used to help block the artery. The procedure will be repeated on the artery supplying blood to the other side of the uterus. The catheter will be removed and a bandage (dressing) will be placed over the incision. The procedure may vary among health care providers and hospitals. What can I expect after the procedure? Your blood pressure, heart rate, breathing rate, and blood oxygen level will be monitored until you leave the hospital or clinic. You will be given pain medicine as needed. You may be given medicine for nausea and  vomiting as needed. If you were given a sedative during the procedure, it can affect you for several hours. Do not drive or operate machinery until your health care provider says it is safe. Summary Uterine artery embolization is a procedure to shrink uterine fibroids by blocking their blood supply. You may be given a medicine to help you relax (sedative) and a medicine to numb the area (local anesthetic) for the procedure. Tiny particles will be injected in the artery to your uterus to block blood flow and shrink the fibroid. After the procedure, you will be given medicine for pain and nausea as needed. If you were given a sedative during the procedure, it can affect you for several hours. Do  not drive or operate machinery until your health care provider says it is safe. This information is not intended to replace advice given to you by your health care provider. Make sure you discuss any questions you have with your health care provider. Document Revised: 12/16/2019 Document Reviewed: 12/16/2019 Elsevier Patient Education  2023 [employer]   Relugolix; Estradiol; Norethindrone Tablets What is this medication? RELUGOLIX; ESTRADIOL; NORETHINDRONE (rel loo GOE lix; es tra DYE ole; nor eth IN drone) helps reduce heavy periods caused by uterine fibroids. It may also be used to treat pain from endometriosis. Relugolix works by decreasing the amount of estrogen and other hormones your body makes, which reduces heavy bleeding and pain. Estradiol lowers the risk of bone loss caused by relugolix. Norethindrone helps lower the risk of cancer that can be caused by estradiol. This medication contains the hormones estrogen and progestin. This medicine may be used for other purposes; ask your health care provider or pharmacist if you have questions. COMMON BRAND NAME(S): Myfembree What should I tell my care team before I take this medication? They need to know if you have any of these conditions: Blood clots Breast, cervical, endometrial, or uterine cancer Diabetes Gallbladder disease Heart disease High blood pressure High cholesterol Kidney disease Liver disease Lupus Mental health condition Migraine headaches Osteoporosis, weak bones Porphyria Stroke Suicidal thoughts, plans, or attempt by you or a family member Tobacco use Unexplained vaginal bleeding An unusual or allergic reaction to relugolix, estrogens, progestins, other medications, foods, dyes, or preservatives Pregnant or trying to get pregnant Breastfeeding How should I use this medication? Take this medication by mouth with water. Take it as directed on the prescription label at the same time every day. You can take it  with or without food. If it upsets your stomach, take it with food. Keep taking it unless your care team tells you to stop. A special MedGuide will be given to you by the pharmacist with each prescription and refill. Be sure to read this information carefully each time. Talk to your care team about the use of this medication in children. It is not approved for use in children. Overdosage: If you think you have taken too much of this medicine contact a poison control center or emergency room at once. NOTE: This medicine is only for you. Do not share this medicine with others. What if I miss a dose? If you miss a dose, take it as soon as you can. If it is almost time for your next dose, take only that dose. Do not take double or extra doses. What may interact with this medication? Do not take this medication with any of the following: Aromatase inhibitors, such as aminoglutethimide, anastrozole, exemestane, letrozole, testolactone Cisapride Dronedarone Elagolix Pimozide Thioridazine This medication may  also interact with the following: Certain antibiotics, such as erythromycin, clarithromycin, telithromycin Certain antivirals for HIV or hepatitis Certain medications for cancer treatment Certain medications for fungal infections, such as ketoconazole, itraconazole, posaconazole Certain medications for seizures, such as carbamazepine, phenobarbital, phenytoin Corticosteroids, such as hydrocortisone, prednisone, prednisolone Cyclosporine Grapefruit juice Medications for diabetes Mifepristone Other medications that cause heart rhythm changes Raloxifene Rifampin St. John's wort Tamoxifen Thyroid hormones Tranexamic acid Tricyclic antidepressants Verapamil Warfarin This list may not describe all possible interactions. Give your health care provider a list of all the medicines, herbs, non-prescription drugs, or dietary supplements you use. Also tell them if you smoke, drink alcohol, or use  illegal drugs. Some items may interact with your medicine. What should I watch for while using this medication? Visit your care team for regular checks on your progress. You will need a regular breast and pelvic exam while on this medication. It may take several months to see improvement in your condition. You may have a change in bleeding pattern, irregular periods, or may stop having periods while taking this medication. Talk with your care team if you may be pregnant. Serious birth defects can occur if you take this medication during pregnancy and for 1 week after the last dose. Contraception is recommended while taking this medication and for 1 week after the last dose. Your care team can help you find the option that works for you. Talk to your care team if you use tobacco products. Changes to your treatment plan may be needed. Tobacco increases the risk of getting a blood clot or having a stroke while taking this medication. The risk is higher if you are over the age of 27. This medication can make your body retain fluid, making your fingers, hands, or ankles swell. Your blood pressure can go up. Contact your care team if you feel you are retaining fluid. Using this medication for a long time may weaken your bones. The risk of bone fractures may be increased. Talk to your care team about your bone health. If you are going to need surgery or other procedure, tell your care team that you are using this medication. What side effects may I notice from receiving this medication? Side effects that you should report to your care team as soon as possible: Allergic reactions--skin rash, itching, hives, swelling of the face, lips, tongue, or throat Blood clot--pain, swelling, or warmth in the leg, shortness of breath, chest pain Gallbladder problems--severe stomach pain, nausea, vomiting, fever Heavy vaginal bleeding Increase in blood pressure Liver injury--right upper belly pain, loss of appetite,  nausea, light-colored stool, dark yellow or brown urine, yellowing skin or eyes, unusual weakness or fatigue Stroke--sudden numbness or weakness of the face, arm, or leg, trouble speaking, confusion, trouble walking, loss of balance or coordination, dizziness, severe headache, change in vision Thoughts of suicide or self-harm, worsening mood, or feelings of depression Side effects that usually do not require medical attention (report these to your care team if they continue or are bothersome): Change in sex drive or performance Dizziness Fatigue Hair loss Headache Hot flashes This list may not describe all possible side effects. Call your doctor for medical advice about side effects. You may report side effects to FDA at 1-800-FDA-1088. Where should I keep my medication? Keep out of the reach of children and pets. Store between 15 and 30 degrees C (59 and 86 degrees F). Get rid of any unused medication after the expiration date. To get rid of medications that  are no longer needed or have expired: Take the medication to a medication take-back program. Check with your pharmacy or law enforcement to find a location. If you cannot return the medication, check the label or package insert to see if the medication should be thrown out in the garbage or flushed down the toilet. If you are not sure, ask your care team. If it is safe to put it in the trash, empty the medication out of the container. Mix the medication with cat litter, dirt, coffee grounds, or other unwanted substance. Seal the mixture in a bag or container. Put it in the trash. NOTE: This sheet is a summary. It may not cover all possible information. If you have questions about this medicine, talk to your doctor, pharmacist, or health care provider.  2023 Elsevier/Gold Standard (2021-10-08 00:00:00)

## 2022-09-08 NOTE — Progress Notes (Deleted)
GYNECOLOGY  VISIT   HPI: 47 y.o.   Married  Caucasian  female   G1P1001 with Patient's last menstrual period was 06/23/2022.   here for   recheck  GYNECOLOGIC HISTORY: Patient's last menstrual period was 06/23/2022. Contraception:  abstinence Menopausal hormone therapy:  n/a Last mammogram:  01/02/20 Breast Density Category B, BI-RADS CAT 1 neg  Last pap smear:   07/25/22 neg: HR HPV neg         OB History     Gravida  1   Para  1   Term  1   Preterm  0   AB  0   Living  1      SAB  0   IAB  0   Ectopic  0   Multiple  0   Live Births  1              Patient Active Problem List   Diagnosis Date Noted   Colon polyp 02/07/2019   Dysfunctional uterine bleeding 02/06/2019   Vitamin B12 deficiency anemia, unspecified 05/01/2018   Muscle spasm 04/10/2018   Iron deficiency anemia, unspecified 02/22/2015   Cholecystitis 12/31/2013   Disequilibrium 12/31/2013   Palpitations 11/05/2013   Elevated blood pressure 11/05/2013   Family history of premature CAD 11/05/2013   Anxiety state 03/29/2011    Past Medical History:  Diagnosis Date   Allergic rhinitis    Anemia    04-29-19 patient having iron infusions--[facility]   Anxiety    Chronic headaches    COVID-19 04/2019   Diverticulosis    Elevated prolactin level 09/19/2019   Essential hypertension    Fibroid    Ultrasound 02/12/19 [facility] - left fundal 6.6 x 6.0 x 6.9 cm, left lateral 4.1 x 3.9 x 3.3 cm.    GERD (gastroesophageal reflux disease)    Heart murmur    SVT   Hx of adenomatous colonic polyps    IBS (irritable bowel syndrome)    Palpitations    Seasonal allergies    Sleep apnea    CPAP    Past Surgical History:  Procedure Laterality Date   COLONOSCOPY Bilateral 06/04/2015   WISDOM TOOTH EXTRACTION      Current Outpatient Medications  Medication Sig Dispense Refill   cetirizine (ZYRTEC) 10 MG tablet Take 10 mg by mouth daily.     clonazePAM (KLONOPIN) 0.5 MG tablet Take  0.5 mg by mouth 2 (two) times daily as needed.     cyanocobalamin (,VITAMIN B-12,) 1000 MCG/ML injection Inject into the muscle.     Cyanocobalamin (VITAMIN B-12 PO) Take by mouth.     cyclobenzaprine (FLEXERIL) 10 MG tablet Take 10 mg by mouth 3 (three) times daily as needed.      metoprolol succinate (TOPROL-XL) 50 MG 24 hr tablet Take 50 mg by mouth daily.     Current Facility-Administered Medications  Medication Dose Route Frequency Provider Last Rate Last Admin   0.9 %  sodium chloride infusion  500 mL Intravenous Once Hilarie Fredrickson, MD         ALLERGIES: Iron dextran, Biaxin [clarithromycin], Cymbalta [duloxetine hcl], Erythromycin, Haemophilus b polysaccharide vaccine, Influenza virus vaccine, Lexapro [escitalopram oxalate], Macrobid [nitrofurantoin], Penicillins, and Sulfa antibiotics  Family History  Problem Relation Age of Onset   Hypertension Mother    CAD Mother        CABG at age 61   Colon polyps Mother 68       pre-cancerous  Polycystic kidney disease Mother    Diabetes Maternal Grandmother    Stroke Maternal Grandmother    Colon cancer Maternal Grandmother        in her 74's   CAD Father        CABG in his late 60s   Ovarian cancer Maternal Aunt    Throat cancer Maternal Uncle    Pancreatic cancer Maternal Aunt    Scoliosis Daughter    Healthy Daughter    Esophageal cancer Neg Hx    Rectal cancer Neg Hx    Stomach cancer Neg Hx     Social History   Socioeconomic History   Marital status: Married    Spouse name: Not on file   Number of children: 1   Years of education: some college   Highest education level: Not on file  Occupational History   Occupation: Diplomatic Services operational officer  Tobacco Use   Smoking status: Never   Smokeless tobacco: Never  Vaping Use   Vaping Use: Never used  Substance and Sexual Activity   Alcohol use: Yes    Comment: Occasional   Drug use: No   Sexual activity: Not Currently  Other Topics Concern   Not on file  Social History  Narrative   Lives at home with her husband.   Right-handed.   1 cup caffeine per day.   Social Determinants of Health   Financial Resource Strain: Not on file  Food Insecurity: Not on file  Transportation Needs: Not on file  Physical Activity: Not on file  Stress: Not on file  Social Connections: Not on file  Intimate Partner Violence: Not on file    Review of Systems  PHYSICAL EXAMINATION:    LMP 06/23/2022     General appearance: alert, cooperative and appears stated age Head: Normocephalic, without obvious abnormality, atraumatic Neck: no adenopathy, supple, symmetrical, trachea midline and thyroid normal to inspection and palpation Lungs: clear to auscultation bilaterally Breasts: normal appearance, no masses or tenderness, No nipple retraction or dimpling, No nipple discharge or bleeding, No axillary or supraclavicular adenopathy Heart: regular rate and rhythm Abdomen: soft, non-tender, no masses,  no organomegaly Extremities: extremities normal, atraumatic, no cyanosis or edema Skin: Skin color, texture, turgor normal. No rashes or lesions Lymph nodes: Cervical, supraclavicular, and axillary nodes normal. No abnormal inguinal nodes palpated Neurologic: Grossly normal  Pelvic: External genitalia:  no lesions              Urethra:  normal appearing urethra with no masses, tenderness or lesions              Bartholins and Skenes: normal                 Vagina: normal appearing vagina with normal color and discharge, no lesions              Cervix: no lesions                Bimanual Exam:  Uterus:  normal size, contour, position, consistency, mobility, non-tender              Adnexa: no mass, fullness, tenderness              Rectal exam: {yes no:314532}.  Confirms.              Anus:  normal sphincter tone, no lesions  Chaperone was present for exam:  ***  ASSESSMENT     PLAN     An After Visit Summary was  printed and given to the patient.  ______ minutes  face to face time of which over 50% was spent in counseling.

## 2022-09-12 LAB — SURGICAL PATHOLOGY

## 2022-09-15 LAB — PROLACTIN: Prolactin: 8.8 ng/mL

## 2022-09-15 LAB — TSH: TSH: 1.58 mIU/L

## 2022-09-15 LAB — FOLLICLE STIMULATING HORMONE: FSH: 7.2 m[IU]/mL

## 2022-09-21 ENCOUNTER — Ambulatory Visit: Payer: BC Managed Care – PPO | Admitting: Obstetrics and Gynecology

## 2022-11-02 ENCOUNTER — Ambulatory Visit: Payer: BC Managed Care – PPO | Admitting: Obstetrics and Gynecology

## 2022-11-02 ENCOUNTER — Encounter: Payer: Self-pay | Admitting: Obstetrics and Gynecology

## 2022-11-02 VITALS — BP 128/88 | HR 72 | Ht 67.0 in | Wt 197.0 lb

## 2022-11-02 DIAGNOSIS — D219 Benign neoplasm of connective and other soft tissue, unspecified: Secondary | ICD-10-CM | POA: Diagnosis not present

## 2022-11-02 DIAGNOSIS — N840 Polyp of corpus uteri: Secondary | ICD-10-CM | POA: Diagnosis not present

## 2022-11-02 MED ORDER — NORETHINDRONE ACETATE 5 MG PO TABS
5.0000 mg | ORAL_TABLET | Freq: Every day | ORAL | 1 refills | Status: DC
Start: 2022-11-02 — End: 2022-11-29

## 2022-11-02 NOTE — Progress Notes (Signed)
GYNECOLOGY  VISIT   HPI: 47 y.o.   Married  Caucasian  female   G1P1001 with Patient's last menstrual period was 09/23/2022.   here for   recheck. Pt is unsure when cycle started. Period began early May and has been on/off for many weeks.  No menses from end of Feb to end of May.   Period has been off an on since May.    LMP 09/23/22, bled for 5 days and then coming and going.   Bled for 10 days in the last month.  She would like to restart Aygestin to stop bleeding.   Has fibroids and anemia.  Pelvic US 09/08/22: Uterus 15.24 x 10.43 x 9.4 cm.  2 fibroids:  left lateral - 9.02 cm, left lower uterine segment pedunculated - 5.28 cm.  Both increased in size sine [place] in 2021.  EMS 14 mm. 18 mm polypoid defect noted.  Left ovary 2.96 x 2.6 x 2.17 cm.  Right ovary 3.73 x 3.07 x 2.18 cm.  1.90 cm simple avascular cyst noted.  No adnexal masses.  No free fluid.   EMB 09/08/22 - polyp and proliferative endometrium.  No hyperplasia or malignancy.  Not sexually active for about 4 months.   Some urinary incontinence with sneezing mostly.   Declines future childbearing.   GYNECOLOGIC HISTORY: Patient's last menstrual period was 09/23/2022. Contraception:  abstinence Menopausal hormone therapy:  n/a Last mammogram:  01/02/20 Breast Density Category B, BI-RADS CAT 1 neg  Last pap smear:   07/25/22 neg: HR HPV neg         OB History     Gravida  1   Para  1   Term  1   Preterm  0   AB  0   Living  1      SAB  0   IAB  0   Ectopic  0   Multiple  0   Live Births  1              Patient Active Problem List   Diagnosis Date Noted   Colon polyp 02/07/2019   Dysfunctional uterine bleeding 02/06/2019   Vitamin B12 deficiency anemia, unspecified 05/01/2018   Muscle spasm 04/10/2018   Iron deficiency anemia, unspecified 02/22/2015   Cholecystitis 12/31/2013   Disequilibrium 12/31/2013   Palpitations 11/05/2013   Elevated blood pressure 11/05/2013   Family history of  premature CAD 11/05/2013   Anxiety state 03/29/2011    Past Medical History:  Diagnosis Date   Allergic rhinitis    Anemia    04-29-19 patient having iron infusions--[facility]   Anxiety    Chronic headaches    COVID-19 04/2019   Diverticulosis    Elevated prolactin level 09/19/2019   Essential hypertension    Fibroid    Ultrasound 02/12/19 [facility] - left fundal 6.6 x 6.0 x 6.9 cm, left lateral 4.1 x 3.9 x 3.3 cm.    GERD (gastroesophageal reflux disease)    Heart murmur    SVT   Hx of adenomatous colonic polyps    IBS (irritable bowel syndrome)    Palpitations    Seasonal allergies    Sleep apnea    CPAP    Past Surgical History:  Procedure Laterality Date   COLONOSCOPY Bilateral 06/04/2015   WISDOM TOOTH EXTRACTION      Current Outpatient Medications  Medication Sig Dispense Refill   cetirizine (ZYRTEC) 10 MG tablet Take 10 mg by mouth daily.  clonazePAM (KLONOPIN) 0.5 MG tablet Take 0.5 mg by mouth 2 (two) times daily as needed.     cyanocobalamin (,VITAMIN B-12,) 1000 MCG/ML injection Inject into the muscle.     Cyanocobalamin (VITAMIN B-12 PO) Take by mouth.     cyclobenzaprine (FLEXERIL) 10 MG tablet Take 10 mg by mouth 3 (three) times daily as needed.      metoprolol succinate (TOPROL-XL) 50 MG 24 hr tablet Take 50 mg by mouth daily.     norethindrone (AYGESTIN) 5 MG tablet Take 1 tablet (5 mg total) by mouth daily. 30 tablet 1   omeprazole (PRILOSEC) 40 MG capsule Take 40 mg by mouth daily.     ondansetron (ZOFRAN) 8 MG tablet Take 8 mg by mouth every 8 (eight) hours as needed.     promethazine (PHENERGAN) 25 MG tablet Take 25 mg by mouth every 8 (eight) hours as needed.     Current Facility-Administered Medications  Medication Dose Route Frequency Provider Last Rate Last Admin   0.9 %  sodium chloride infusion  500 mL Intravenous Once Hilarie Fredrickson, MD         ALLERGIES: Iron dextran, Biaxin [clarithromycin], Cymbalta [duloxetine hcl],  Erythromycin, Haemophilus b polysaccharide vaccine, Influenza virus vaccine, Lexapro [escitalopram oxalate], Macrobid [nitrofurantoin], Penicillins, and Sulfa antibiotics  Family History  Problem Relation Age of Onset   Hypertension Mother    CAD Mother        CABG at age 31   Colon polyps Mother 4       pre-cancerous   Polycystic kidney disease Mother    Diabetes Maternal Grandmother    Stroke Maternal Grandmother    Colon cancer Maternal Grandmother        in her 44's   CAD Father        CABG in his late 53s   Ovarian cancer Maternal Aunt    Throat cancer Maternal Uncle    Pancreatic cancer Maternal Aunt    Scoliosis Daughter    Healthy Daughter    Esophageal cancer Neg Hx    Rectal cancer Neg Hx    Stomach cancer Neg Hx     Social History   Socioeconomic History   Marital status: Married    Spouse name: Not on file   Number of children: 1   Years of education: some college   Highest education level: Not on file  Occupational History   Occupation: Diplomatic Services operational officer  Tobacco Use   Smoking status: Never   Smokeless tobacco: Never  Vaping Use   Vaping Use: Never used  Substance and Sexual Activity   Alcohol use: Yes    Comment: Occasional   Drug use: No   Sexual activity: Not Currently  Other Topics Concern   Not on file  Social History Narrative   Lives at home with her husband.   Right-handed.   1 cup caffeine per day.   Social Determinants of Health   Financial Resource Strain: Not on file  Food Insecurity: Not on file  Transportation Needs: Not on file  Physical Activity: Not on file  Stress: Not on file  Social Connections: Not on file  Intimate Partner Violence: Not on file    Review of Systems  All other systems reviewed and are negative.   PHYSICAL EXAMINATION:    BP 128/88 (BP Location: Left Arm, Patient Position: Sitting, Cuff Size: Normal)   Pulse 72   Ht 5\' 7"  (1.702 m)   Wt 197 lb (89.4 kg)   LMP  09/23/2022   SpO2 98%   BMI 30.85 kg/m      General appearance: alert, cooperative and appears stated age  ASSESSMENT  Symptomatic large fibroids, including a pedunculated fibroid. Endometrial polyp. Anemia.  Stress urinary incontinence.   PLAN  Care options reviewed with patient and focus of discussion was laparoscopic hysterectomy. We discussed potential total laparoscopic hysterectomy with bilateral salpingectomy +/- anti-incontinence procedure by urogynecologist or urologist.  Written information also given to patient.  Rx for Aygestin 5 mg daily.  #30, RF one.  She accepts referral to Dr. Hyacinth Meeker for surgical care.  She will need to update her mammogram.    30 min  total time was spent for this patient encounter, including preparation, face-to-face counseling with the patient, coordination of care, and documentation of the encounter.

## 2022-11-02 NOTE — Patient Instructions (Signed)
Total Laparoscopic Hysterectomy A total laparoscopic hysterectomy is a minimally invasive surgery to remove the uterus and cervix. The fallopian tubes and ovaries can also be removed during this surgery, if necessary. This procedure may be done to treat problems such as: Growths in the uterus (uterine fibroids) that are not cancer but cause symptoms. A condition that causes the lining of the uterus to grow in other areas (endometriosis). Problems with pelvic support. Cancer of the cervix, ovaries, uterus, or tissue that lines the uterus (endometrium). Excessive bleeding in the uterus. After this procedure, you will no longer be able to have a baby, and you will no longer have a menstrual period. Tell a health care provider about: Any allergies you have. All medicines you are taking, including vitamins, herbs, eye drops, creams, and over-the-counter medicines. Any problems you or family members have had with anesthetic medicines. Any blood disorders you have. Any surgeries you have had. Any medical conditions you have. Whether you are pregnant or may be pregnant. What are the risks? Generally, this is a safe procedure. However, problems may occur, including: Infection. Bleeding. Blood clots in the legs or lungs. Allergic reactions to medicines. Damage to nearby structures or organs. Having to change from this surgery to one in which a large incision is made in the abdomen (abdominal hysterectomy). What happens before the procedure? Staying hydrated Follow instructions from your health care provider about hydration, which may include: Up to 2 hours before the procedure - you may continue to drink clear liquids, such as water, clear fruit juice, black coffee, and plain tea.  Eating and drinking restrictions Follow instructions from your health care provider about eating and drinking, which may include: 8 hours before the procedure - stop eating heavy meals or foods, such as meat, fried  foods, or fatty foods. 6 hours before the procedure - stop eating light meals or foods, such as toast or cereal. 6 hours before the procedure - stop drinking milk or drinks that contain milk. 2 hours before the procedure - stop drinking clear liquids. Medicines Take over-the-counter and prescription medicines only as told by your health care provider. You may be asked to take medicine that helps you have a bowel movement (laxative) to prevent constipation. General instructions If you were asked to do bowel preparation before the procedure, follow instructions from your health care provider. This procedure can affect the way you feel about yourself. Talk with your health care provider about the physical and emotional changes hysterectomy may cause. Do not use any products that contain nicotine or tobacco for at least 4 weeks before the procedure. These products include cigarettes, chewing tobacco, and vaping devices, such as e-cigarettes. If you need help quitting, ask your health care provider. Plan to have a responsible adult take you home from the hospital or clinic. Plan to have a responsible adult care for you for the time you are told after you leave the hospital or clinic. This is important. Surgery safety Ask your health care provider: How your surgery site will be marked. What steps will be taken to help prevent infection. These may include: Removing hair at the surgery site. Washing skin with a germ-killing soap. Receiving antibiotic medicine. What happens during the procedure? An IV will be inserted into one of your veins. You will be given one or more of the following: A medicine to help you relax (sedative). A medicine to make you fall asleep (general anesthetic). A medicine to numb the area (local anesthetic). A medicine  that is injected into your spine to numb the area below and slightly above the injection site (spinal anesthetic). A medicine that is injected into an area of  your body to numb everything below the injection site (regional anesthetic). A gas will be used to inflate your abdomen. This will allow your surgeon to look inside your abdomen and do the surgery. Three or four small incisions will be made in your abdomen. A small device with a light (laparoscope) will be inserted into one of your incisions. Surgical instruments will be inserted through the other incisions in order to perform the procedure. Your uterus and cervix may be removed through your vagina or cut into small pieces and removed through the small incisions. Any other organs that need to be removed will also be removed this way. The gas will be released from inside your abdomen. Your incisions will be closed with stitches (sutures), skin glue, or adhesive strips. A bandage (dressing) may be placed over your incisions. The procedure may vary among health care providers and hospitals. What happens after the procedure? Your blood pressure, heart rate, breathing rate, and blood oxygen level will be monitored until you leave the hospital or clinic. You will be given medicine for pain as needed. You will be encouraged to walk as soon as possible. You will also use a device to help you breathe or do breathing exercises to keep your lungs clear. You may have to wear compression stockings. These stockings help to prevent blood clots and reduce swelling in your legs. You will need to wear a sanitary pad for vaginal discharge or bleeding. Summary Total laparoscopic hysterectomy is a procedure to remove your uterus, cervix, and sometimes the fallopian tubes and ovaries. This procedure can affect the way you feel about yourself. Talk with your health care provider about the physical and emotional changes hysterectomy may cause. After this procedure, you will no longer be able to have a baby, and you will no longer have a menstrual period. You will be given pain medicine to control discomfort after this  procedure. Plan to have a responsible adult take you home from the hospital or clinic. This information is not intended to replace advice given to you by your health care provider. Make sure you discuss any questions you have with your health care provider. Document Revised: 07/21/2021 Document Reviewed: 01/10/2020 Elsevier Patient Education  2024 [employer].

## 2022-11-03 ENCOUNTER — Telehealth: Payer: Self-pay | Admitting: Obstetrics and Gynecology

## 2022-11-03 DIAGNOSIS — D219 Benign neoplasm of connective and other soft tissue, unspecified: Secondary | ICD-10-CM

## 2022-11-03 NOTE — Telephone Encounter (Signed)
Please make a referral to Dr. Leda Quail for surgical consultation for potential laparoscopic hysterectomy for fibroids.  Please contact patient regarding the importance of her updating her mammogram.  Her last mammogram was in 2021 according to our records.  I recommend she proceed with this as soon as possible for best well woman care and also in order to complete preparation for surgical care.

## 2022-11-04 NOTE — Telephone Encounter (Signed)
LDVM on machine per DPR regarding Dr. Rica Records recommendation to complete mammogram screening ASAP and provided her w/ [facility] phone number to call at her earliest convenience. Referral placed and to be f/u by staff by work queue. Will route to provider for final review and close encounter.

## 2022-11-29 ENCOUNTER — Other Ambulatory Visit: Payer: Self-pay | Admitting: Obstetrics and Gynecology

## 2022-11-29 NOTE — Telephone Encounter (Signed)
Med refill request: Aygestin Last AEX: 07/25/22 Next AEX: not scheduled Last MMG (if hormonal med) 01/02/20 Refill authorized: Please Advise, requesting #[phone]

## 2023-01-17 ENCOUNTER — Ambulatory Visit (HOSPITAL_BASED_OUTPATIENT_CLINIC_OR_DEPARTMENT_OTHER): Payer: BC Managed Care – PPO | Admitting: Obstetrics & Gynecology

## 2023-01-17 ENCOUNTER — Encounter (HOSPITAL_BASED_OUTPATIENT_CLINIC_OR_DEPARTMENT_OTHER): Payer: Self-pay | Admitting: Obstetrics & Gynecology

## 2023-01-17 VITALS — BP 140/87 | HR 75 | Ht 67.0 in | Wt 201.6 lb

## 2023-01-17 DIAGNOSIS — D500 Iron deficiency anemia secondary to blood loss (chronic): Secondary | ICD-10-CM

## 2023-01-17 DIAGNOSIS — N921 Excessive and frequent menstruation with irregular cycle: Secondary | ICD-10-CM | POA: Diagnosis not present

## 2023-01-17 DIAGNOSIS — N840 Polyp of corpus uteri: Secondary | ICD-10-CM | POA: Diagnosis not present

## 2023-01-17 DIAGNOSIS — D251 Intramural leiomyoma of uterus: Secondary | ICD-10-CM

## 2023-01-17 DIAGNOSIS — D252 Subserosal leiomyoma of uterus: Secondary | ICD-10-CM

## 2023-01-17 NOTE — Progress Notes (Signed)
GYNECOLOGY  VISIT  CC:   consult regarding fibroids  HPI: 47 y.o. G16P1001 Married White or Caucasian female here for discussion of treatment of uterine fibroids.  Last u/s 08/2022 showed uterus 15 x 10.4 x 9.4cm ith left lateral 9cm and pedunculated 5cm fibroids.  These have enlarged since prior imaging done in 2021.  She did have an endometrial biopsy in April as well.  There does appear to be a polyp present.  Ultrasound images reviewed personally.  Cycles are irregular at time.  Flow can be heavy at times.  She did have one episode about a year ago in August where she just have very heavy bleeding, soaking through clothes and she was concerned she would need to go to the ER.    Past Medical History:  Diagnosis Date   Allergic rhinitis    Anemia    04-29-19 patient having iron infusions--[facility]   Anxiety    Chronic headaches    COVID-19 04/2019   Diverticulosis    Elevated prolactin level 09/19/2019   Essential hypertension    Fibroid    Ultrasound 02/12/19 [facility] - left fundal 6.6 x 6.0 x 6.9 cm, left lateral 4.1 x 3.9 x 3.3 cm.    GERD (gastroesophageal reflux disease)    Heart murmur    SVT   Hx of adenomatous colonic polyps    IBS (irritable bowel syndrome)    Palpitations    Seasonal allergies    Sleep apnea    CPAP    MEDS:   Current Outpatient Medications on File Prior to Visit  Medication Sig Dispense Refill   cetirizine (ZYRTEC) 10 MG tablet Take 10 mg by mouth daily.     cyanocobalamin (,VITAMIN B-12,) 1000 MCG/ML injection Inject into the muscle.     Cyanocobalamin (VITAMIN B-12 PO) Take by mouth.     cyclobenzaprine (FLEXERIL) 10 MG tablet Take 10 mg by mouth 3 (three) times daily as needed.      metoprolol succinate (TOPROL-XL) 50 MG 24 hr tablet Take 50 mg by mouth daily.     omeprazole (PRILOSEC) 40 MG capsule Take 40 mg by mouth daily.     Current Facility-Administered Medications on File Prior to Visit  Medication Dose Route Frequency  Provider Last Rate Last Admin   0.9 %  sodium chloride infusion  500 mL Intravenous Once Hilarie Fredrickson, MD        ALLERGIES: Iron dextran, Biaxin [clarithromycin], Cymbalta [duloxetine hcl], Erythromycin, Haemophilus b polysaccharide vaccine, Influenza virus vaccine, Lexapro [escitalopram oxalate], Macrobid [nitrofurantoin], Penicillins, and Sulfa antibiotics  SH:  married, non smoker  Review of Systems  Constitutional: Negative.   Genitourinary:        Heavy bleeding    PHYSICAL EXAMINATION:    BP (!) 140/87 (BP Location: Right Arm, Patient Position: Sitting, Cuff Size: Large)   Pulse 75   Ht 5\' 7"  (1.702 m)   Wt 201 lb 9.6 oz (91.4 kg)   LMP 10/26/2022   BMI 31.58 kg/m     Physical Exam Constitutional:      Appearance: Normal appearance.  Neurological:     General: No focal deficit present.     Mental Status: She is alert.  Psychiatric:        Mood and Affect: Mood normal.        Behavior: Behavior normal.      Assessment/Plan: 1. Intramural and subserous leiomyoma of uterus - treatment options discussed with pt including myomectomy, hysterectomy and  [place].  The pedunculated fibroid will not be treated with [place] but the larger one that is likely causing more of her symptoms would likely be adequately treated.  Procedure, hospital stay, length of time for response to treatment discussed. - will order MRI for pt and refer to IR  2. Menorrhagia with irregular cycle - last hb was 09/01/2022 - MR PELVIS W WO CONTRAST; Future - Ambulatory referral to Interventional Radiology  3. Iron deficiency anemia due to chronic blood loss  4.  Endometrial polyp - pt may need hysteroscopy as well but could wait and see what follow up MRI showed after [place] (as pt did have biopsy with no abnormal cells noted in 08/2022).

## 2023-01-30 ENCOUNTER — Other Ambulatory Visit (HOSPITAL_BASED_OUTPATIENT_CLINIC_OR_DEPARTMENT_OTHER): Payer: Self-pay | Admitting: Obstetrics & Gynecology

## 2023-01-30 DIAGNOSIS — D251 Intramural leiomyoma of uterus: Secondary | ICD-10-CM

## 2023-01-30 DIAGNOSIS — N921 Excessive and frequent menstruation with irregular cycle: Secondary | ICD-10-CM

## 2023-01-30 DIAGNOSIS — D500 Iron deficiency anemia secondary to blood loss (chronic): Secondary | ICD-10-CM

## 2023-03-13 ENCOUNTER — Other Ambulatory Visit: Payer: BC Managed Care – PPO

## 2023-03-19 ENCOUNTER — Other Ambulatory Visit: Payer: BC Managed Care – PPO

## 2023-04-06 ENCOUNTER — Telehealth (HOSPITAL_BASED_OUTPATIENT_CLINIC_OR_DEPARTMENT_OTHER): Payer: Self-pay | Admitting: *Deleted

## 2023-04-06 NOTE — Telephone Encounter (Signed)
Patient called and would for the nurse to please call her about the referral .

## 2023-04-07 NOTE — Telephone Encounter (Signed)
LMOVM for pt to call office 

## 2023-05-03 ENCOUNTER — Encounter (HOSPITAL_BASED_OUTPATIENT_CLINIC_OR_DEPARTMENT_OTHER): Payer: Self-pay | Admitting: Obstetrics & Gynecology

## 2023-05-10 ENCOUNTER — Ambulatory Visit
Admission: RE | Admit: 2023-05-10 | Discharge: 2023-05-10 | Disposition: A | Discharge status: Home / Self Care | Payer: BC Managed Care – PPO | Source: Ambulatory Visit | Attending: Obstetrics & Gynecology | Admitting: Obstetrics & Gynecology

## 2023-05-10 DIAGNOSIS — D251 Intramural leiomyoma of uterus: Secondary | ICD-10-CM

## 2023-05-10 DIAGNOSIS — D500 Iron deficiency anemia secondary to blood loss (chronic): Secondary | ICD-10-CM

## 2023-05-10 DIAGNOSIS — N921 Excessive and frequent menstruation with irregular cycle: Secondary | ICD-10-CM

## 2023-06-20 ENCOUNTER — Ambulatory Visit (HOSPITAL_BASED_OUTPATIENT_CLINIC_OR_DEPARTMENT_OTHER): Payer: 59 | Admitting: Obstetrics & Gynecology

## 2023-06-20 ENCOUNTER — Encounter (HOSPITAL_BASED_OUTPATIENT_CLINIC_OR_DEPARTMENT_OTHER): Payer: Self-pay | Admitting: Obstetrics & Gynecology

## 2023-06-20 VITALS — BP 133/69 | HR 71 | Ht 67.0 in | Wt 199.0 lb

## 2023-06-20 DIAGNOSIS — D251 Intramural leiomyoma of uterus: Secondary | ICD-10-CM

## 2023-06-20 DIAGNOSIS — D500 Iron deficiency anemia secondary to blood loss (chronic): Secondary | ICD-10-CM

## 2023-06-20 DIAGNOSIS — N921 Excessive and frequent menstruation with irregular cycle: Secondary | ICD-10-CM | POA: Diagnosis not present

## 2023-06-20 DIAGNOSIS — N840 Polyp of corpus uteri: Secondary | ICD-10-CM

## 2023-06-20 DIAGNOSIS — D252 Subserosal leiomyoma of uterus: Secondary | ICD-10-CM

## 2023-06-20 MED ORDER — NORETHINDRONE ACETATE 5 MG PO TABS
5.0000 mg | ORAL_TABLET | Freq: Two times a day (BID) | ORAL | 3 refills | Status: AC
Start: 2023-06-20 — End: 2023-10-18

## 2023-06-23 NOTE — Progress Notes (Signed)
GYNECOLOGY  VISIT  CC:   discuss fibroid treatment  HPI: 48 y.o. G1P1001 Married White or Caucasian female here for discussion of treatment for fibroids and menorrhagia.  Pt has u/s 08/2022 showing enlarged uterus with uterus 15 x 10.4 x 9.4cm with a left latera 9cm fibroids and pedunculated 5cm fibroid.  She was seen in August. Treatment options discussed.  She was most interested in [place], at that time.  She was iron deficient so IV iron was ordered.  Pelvic MRI prior to IR consult also ordered.  IV iron was initiated very quickly.  MRI scheduling was more difficult.  By the time she was able to get the MRI, she has already received IV iron and was unable to have the MRI done. Has to wait three months for that.  Last iron infusion was 04/18/2023.    She is taking norethindrone and it has helped bleeding some.  She is feeling symptoms from the size of the uterus at this point and really ready for more definitive surgery.  Options reviewed.  Also, increased progesterone can be used to help control bleeding further.  She is fine with this as well .   Past Medical History:  Diagnosis Date   Allergic rhinitis    Anemia    04-29-19 patient having iron infusions--[facility]   Anxiety    Chronic headaches    COVID-19 04/2019   Diverticulosis    Elevated prolactin level 09/19/2019   Essential hypertension    Fibroid    Ultrasound 02/12/19 [facility] - left fundal 6.6 x 6.0 x 6.9 cm, left lateral 4.1 x 3.9 x 3.3 cm.    GERD (gastroesophageal reflux disease)    Heart murmur    SVT   Hx of adenomatous colonic polyps    IBS (irritable bowel syndrome)    Palpitations    Seasonal allergies    Sleep apnea    CPAP    MEDS:   Current Outpatient Medications on File Prior to Visit  Medication Sig Dispense Refill   cetirizine (ZYRTEC) 10 MG tablet Take 10 mg by mouth daily.     cyanocobalamin (,VITAMIN B-12,) 1000 MCG/ML injection Inject into the muscle.     Cyanocobalamin (VITAMIN B-12 PO)  Take by mouth.     cyclobenzaprine (FLEXERIL) 10 MG tablet Take 10 mg by mouth 3 (three) times daily as needed.      metoprolol succinate (TOPROL-XL) 50 MG 24 hr tablet Take 50 mg by mouth daily.     omeprazole (PRILOSEC) 40 MG capsule Take 40 mg by mouth daily.     Current Facility-Administered Medications on File Prior to Visit  Medication Dose Route Frequency Provider Last Rate Last Admin   0.9 %  sodium chloride infusion  500 mL Intravenous Once Hilarie Fredrickson, MD        ALLERGIES: Iron dextran, Biaxin [clarithromycin], Cymbalta [duloxetine hcl], Erythromycin, Haemophilus b polysaccharide vaccine, Influenza virus vaccine, Lexapro [escitalopram oxalate], Macrobid [nitrofurantoin], and Penicillins  SH:  married, non smoker  Review of Systems  Constitutional: Negative.   Genitourinary:        Bladder pressure, urinary frequency    PHYSICAL EXAMINATION:    BP 133/69 (BP Location: Right Arm, Patient Position: Sitting, Cuff Size: Normal)   Pulse 71   Ht 5\' 7"  (1.702 m)   Wt 199 lb (90.3 kg)   LMP 05/29/2023   BMI 31.17 kg/m     General appearance: alert, cooperative and appears stated age Abdomen: soft,  non-tender; bowel sounds normal; enlarged uterus can be felt on left side of    Assessment/Plan: 1. Intramural and subserous leiomyoma of uterus (Primary) - will proceed with surgery scheduling - Ambulatory Referral For Surgery Scheduling  2. Menorrhagia with irregular cycle - will increase progesterone to BID dosing - norethindrone (AYGESTIN) 5 MG tablet; Take 1 tablet (5 mg total) by mouth 2 (two) times daily.  Dispense: 60 tablet; Refill: 3  3. Iron deficiency anemia due to chronic blood loss - last hb was 03/30/2023 and was 12.6.  will not repeat lab work today.  4. Endometrial polyp

## 2023-06-30 ENCOUNTER — Encounter (HOSPITAL_BASED_OUTPATIENT_CLINIC_OR_DEPARTMENT_OTHER): Payer: Self-pay | Admitting: Radiology

## 2023-06-30 ENCOUNTER — Ambulatory Visit (HOSPITAL_BASED_OUTPATIENT_CLINIC_OR_DEPARTMENT_OTHER)
Admission: RE | Admit: 2023-06-30 | Discharge: 2023-06-30 | Disposition: A | Discharge status: Home / Self Care | Payer: 59 | Source: Ambulatory Visit | Attending: Family Medicine | Admitting: Family Medicine

## 2023-06-30 DIAGNOSIS — Z1231 Encounter for screening mammogram for malignant neoplasm of breast: Secondary | ICD-10-CM | POA: Diagnosis present

## 2023-08-01 ENCOUNTER — Telehealth: Payer: Self-pay

## 2023-08-01 NOTE — Telephone Encounter (Unsigned)
 Called patient to schedule surgery. I left a voicemail requesting patient call me back to schedule [phone].

## 2023-08-21 ENCOUNTER — Telehealth: Payer: Self-pay

## 2023-08-21 NOTE — Telephone Encounter (Signed)
 I reached out to patient to see if she was available for surgery on 09/26/23 w/ Dr. Hyacinth Meeker at 7:30 am. I asked patient to call me back to schedule at [phone].

## 2023-08-30 ENCOUNTER — Telehealth: Payer: Self-pay

## 2023-08-30 NOTE — Telephone Encounter (Signed)
 I called patient to schedule surgery w/ Dr. Hyacinth Meeker on 10/24/23 or 10/31/23. I left a second voicemail asking patient to call me to confirm her availability. [phone].

## 2023-09-02 ENCOUNTER — Other Ambulatory Visit: Payer: Self-pay | Admitting: Obstetrics & Gynecology

## 2023-09-02 DIAGNOSIS — N921 Excessive and frequent menstruation with irregular cycle: Secondary | ICD-10-CM

## 2023-09-04 NOTE — Telephone Encounter (Signed)
 Medication refill request: aygestin 5mg   Last AEX:  07/25/22 Next AEX: not scheduled. Note sent to pharmacy for patient to scheduled aex.  Last MMG (if hormonal medication request): 07/04/23 bi-rads 1 neg  Refill authorized: please advise

## 2023-09-14 ENCOUNTER — Telehealth: Payer: Self-pay

## 2023-09-14 NOTE — Telephone Encounter (Signed)
 Called patient to schedule surgery w/ Dr. Annabell Key on 610/25. Left voicemail asking patient to call back to schedule at [phone].

## 2023-11-01 ENCOUNTER — Other Ambulatory Visit: Payer: Self-pay

## 2023-11-01 ENCOUNTER — Ambulatory Visit: Payer: Self-pay | Admitting: Allergy & Immunology

## 2023-11-01 ENCOUNTER — Encounter: Payer: Self-pay | Admitting: Allergy & Immunology

## 2023-11-01 VITALS — BP 110/70 | HR 79 | Temp 97.2°F | Resp 18 | Ht 66.54 in | Wt 194.0 lb

## 2023-11-01 DIAGNOSIS — T7840XD Allergy, unspecified, subsequent encounter: Secondary | ICD-10-CM | POA: Diagnosis not present

## 2023-11-01 DIAGNOSIS — R1310 Dysphagia, unspecified: Secondary | ICD-10-CM | POA: Diagnosis not present

## 2023-11-01 DIAGNOSIS — J310 Chronic rhinitis: Secondary | ICD-10-CM

## 2023-11-01 DIAGNOSIS — T7840XA Allergy, unspecified, initial encounter: Secondary | ICD-10-CM

## 2023-11-01 NOTE — Patient Instructions (Signed)
 1. Allergic reaction - unknown trigger - We are going to get some labs to look for weird causes of allergic reactions. - Emergency Action Plan provided. - EpiPen reviewed.   2. Chronic rhinitis - Because of insurance stipulations, we cannot do skin testing on the same day as your first visit. - We are all working to fight this, but for now we need to do two separate visits.  - We will know more after we do testing at the next visit.  - The skin testing visit can be squeezed in at your convenience.  - Then we can make a more full plan to address all of your symptoms. - Be sure to stop your antihistamines for 3 days before this appointment.   3. Dysphagia - I would consider doing an endoscopy.  - This might be eosinophilic esophagitis.   4. Return in about 1 week (around 11/08/2023) for SKIN TESTING (1-68). You can have the follow up appointment with Dr. Idolina Maker or a Nurse Practicioner (our Nurse Practitioners are excellent and always have Physician oversight!).    Please inform us  of any Emergency Department visits, hospitalizations, or changes in symptoms. Call us  before going to the ED for breathing or allergy symptoms since we might be able to fit you in for a sick visit. Feel free to contact us  anytime with any questions, problems, or concerns.  It was a pleasure to see you and your family again today!  Websites that have reliable patient information: 1. American Academy of Asthma, Allergy, and Immunology: www.aaaai.org 2. Food Allergy Research and Education (FARE): foodallergy.org 3. Mothers of Asthmatics: http://www.asthmacommunitynetwork.org 4. American College of Allergy, Asthma, and Immunology: www.acaai.org      "Like" us  on Facebook and Instagram for our latest updates!      A healthy democracy works best when [employer] participate! Make sure you are registered to vote! If you have moved or changed any of your contact information, you will need to get this updated  before voting! Scan the QR codes below to learn more!

## 2023-11-01 NOTE — Progress Notes (Signed)
 NEW PATIENT  Date of Service/Encounter:  11/01/23  Consult requested by: Alston Jerry, MD   Assessment:   Allergic reaction  Chronic rhinitis - planning for skin testing at the next visit  Dysphagia - possible EoE  Plan/Recommendations:   1. Allergic reaction - unknown trigger - We are going to get some labs to look for weird causes of allergic reactions. - Emergency Action Plan provided. - EpiPen reviewed.   2. Chronic rhinitis - Because of insurance stipulations, we cannot do skin testing on the same day as your first visit. - We are all working to fight this, but for now we need to do two separate visits.  - We will know more after we do testing at the next visit.  - The skin testing visit can be squeezed in at your convenience.  - Then we can make a more full plan to address all of your symptoms. - Be sure to stop your antihistamines for 3 days before this appointment.   3. Dysphagia - I would consider doing an endoscopy.  - This might be eosinophilic esophagitis.   4. Return in about 1 week (around 11/08/2023) for SKIN TESTING (1-68). You can have the follow up appointment with Dr. Idolina Maker or a Nurse Practicioner (our Nurse Practitioners are excellent and always have Physician oversight!).   This note in its entirety was forwarded to the Provider who requested this consultation.  Subjective:   Lynn Spears is a 48 y.o. female presenting today for evaluation of  Chief Complaint  Patient presents with   Allergies    Last fall she started having allergic reactions, wanted for find out what she is allergic to    Lynn Spears has a history of the following: Patient Active Problem List   Diagnosis Date Noted   Colon polyp 02/07/2019   Dysfunctional uterine bleeding 02/06/2019   Vitamin B12 deficiency anemia, unspecified 05/01/2018   Muscle spasm 04/10/2018   Iron deficiency anemia, unspecified 02/22/2015   Cholecystitis 12/31/2013   Disequilibrium  12/31/2013   Palpitations 11/05/2013   Elevated blood pressure 11/05/2013   Family history of premature CAD 11/05/2013   Anxiety state 03/29/2011    History obtained from: chart review and patient.  Discussed the use of AI scribe software for clinical note transcription with the patient and/or guardian, who gave verbal consent to proceed.  Lynn Spears was referred by Alston Jerry, MD.     Lynn Spears is a 48 y.o. female presenting for an evaluation of anaphylaxis.  She has been experiencing allergic reactions localized to her face since October 2024, characterized by irritation, swelling, and occasionally a hoarse voice, lasting a couple of days. There are no full-body reactions or breathing difficulties, although she notes a sensation of food not moving smoothly during swallowing, without choking. No wheezing, breathing difficulties, or systemic allergic reactions. Reports facial redness, blotchiness, itchiness, and swelling of the eyes during episodes.   She has not identified any specific food triggers for her allergic reactions, noting that she can eat various foods without immediate issues, but reactions can occur unpredictably. She carries an EpiPen, provided in the fall of 2024, for emergencies.  Allergic Rhinitis Symptom History: She manages her symptoms with daily Zyrtec and Benadryl as needed. During severe episodes, she has used leftover prednisone, which helps reduce the severity but does not completely resolve them. She has not required hospital visits for these reactions and has not had any issues with breathing or wheezing.  She has a history of anemia since 2019, managed by an oncologist at [facility]. She has undergone colonoscopies due to concerns about iron levels but has not been tested for eosinophilic esophagitis. Her reactions do not seem to be related to iron infusions.  Otherwise, there is no history of other atopic diseases, including drug allergies, stinging  insect allergies, or contact dermatitis. There is no significant infectious history. Vaccinations are up to date.    Past Medical History: Patient Active Problem List   Diagnosis Date Noted   Colon polyp 02/07/2019   Dysfunctional uterine bleeding 02/06/2019   Vitamin B12 deficiency anemia, unspecified 05/01/2018   Muscle spasm 04/10/2018   Iron deficiency anemia, unspecified 02/22/2015   Cholecystitis 12/31/2013   Disequilibrium 12/31/2013   Palpitations 11/05/2013   Elevated blood pressure 11/05/2013   Family history of premature CAD 11/05/2013   Anxiety state 03/29/2011    Medication List:  Allergies as of 11/01/2023       Reactions   Iron Dextran Shortness Of Breath   Patient received 15.50ml of test dose and became shob, with feelings of anxiety.  Solumedrol and zyrtec were given.  Patient also had premedications of benadryl 50mg  and tylenol 650mg  30 minutes prior to administration.   Biaxin [clarithromycin] Other (See Comments)   GI upset   Cymbalta  [duloxetine  Hcl]    Vomiting    Erythromycin    GI upset   Haemophilus B Polysaccharide Vaccine    Neurologic reaction.   Influenza Virus Vaccine    Neurologic reaction.   Lexapro [escitalopram Oxalate]    Flu like Sx   Macrobid [nitrofurantoin] Nausea And Vomiting   Penicillins Rash        Medication List        Accurate as of November 01, 2023 11:59 PM. If you have any questions, ask your nurse or doctor.          cetirizine 10 MG tablet Commonly known as: ZYRTEC Take 10 mg by mouth daily.   cyanocobalamin 1000 MCG/ML injection Commonly known as: VITAMIN B12 Inject into the muscle.   VITAMIN B-12 PO Take by mouth.   cyclobenzaprine 10 MG tablet Commonly known as: FLEXERIL Take 10 mg by mouth 3 (three) times daily as needed.   metoprolol succinate 50 MG 24 hr tablet Commonly known as: TOPROL-XL Take 50 mg by mouth daily.   Neffy 2 MG/0.1ML Soln Generic drug: EPINEPHrine Place 1 spray into the nose  as needed (for an allergic reaction). Started by: [place] Chuck   norethindrone  5 MG tablet Commonly known as: Aygestin  Take 1 tablet (5 mg total) by mouth 2 (two) times daily.   omeprazole 40 MG capsule Commonly known as: PRILOSEC Take 40 mg by mouth daily.        Birth History: non-contributory  Developmental History: non-contributory  Past Surgical History: Past Surgical History:  Procedure Laterality Date   COLONOSCOPY Bilateral 06/04/2015   WISDOM TOOTH EXTRACTION       Family History: Family History  Problem Relation Age of Onset   Hypertension Mother    CAD Mother        CABG at age 8   Colon polyps Mother 6       pre-cancerous   Polycystic kidney disease Mother    Diabetes Maternal Grandmother    Stroke Maternal Grandmother    Colon cancer Maternal Grandmother        in her 71's   CAD Father  CABG in his late 83s   Ovarian cancer Maternal Aunt    Throat cancer Maternal Uncle    Pancreatic cancer Maternal Aunt    Scoliosis Daughter    Healthy Daughter    Esophageal cancer Neg Hx    Rectal cancer Neg Hx    Stomach cancer Neg Hx      Social History: Lynn Spears lives at home with her family. She lives in a house that was built in the 1970s. There is hardwood and carpeting in the main living areas. There is a heat pump for heating and central cooling. There is a dog in the home. There are no dust mite coverings on the bedding. There is no tobacco exposure. 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 Thre is dust exposure. There is no HEPA filter in the home.    Review of systems otherwise negative other than that mentioned in the HPI.    Objective:   Blood pressure 110/70, pulse 79, temperature (!) 97.2 F (36.2 C), temperature source Temporal, resp. rate 18, height 5' 6.54 (1.69 m), weight 194 lb (88 kg), SpO2 96%. Body mass index is 30.81 kg/m.     Physical Exam Vitals reviewed.  Constitutional:       Appearance: She is well-developed.     Comments: Pleasant.  Cooperative with the exam.  HENT:     Head: Normocephalic and atraumatic.     Right Ear: Tympanic membrane, ear canal and external ear normal. No drainage, swelling or tenderness. Tympanic membrane is not injected, scarred, erythematous, retracted or bulging.     Left Ear: Tympanic membrane, ear canal and external ear normal. No drainage, swelling or tenderness. Tympanic membrane is not injected, scarred, erythematous, retracted or bulging.     Nose: No nasal deformity, septal deviation, mucosal edema or rhinorrhea.     Right Turbinates: Enlarged, swollen and pale.     Left Turbinates: Enlarged and pale.     Right Sinus: No maxillary sinus tenderness or frontal sinus tenderness.     Left Sinus: No maxillary sinus tenderness or frontal sinus tenderness.     Mouth/Throat:     Mouth: Mucous membranes are not pale and not dry.     Pharynx: Uvula midline.   Eyes:     General:        Right eye: No discharge.        Left eye: No discharge.     Conjunctiva/sclera: Conjunctivae normal.     Right eye: Right conjunctiva is not injected. No chemosis.    Left eye: Left conjunctiva is not injected. No chemosis.    Pupils: Pupils are equal, round, and reactive to light.    Cardiovascular:     Rate and Rhythm: Normal rate and regular rhythm.     Heart sounds: Normal heart sounds.  Pulmonary:     Effort: Pulmonary effort is normal. No tachypnea, accessory muscle usage or respiratory distress.     Breath sounds: Normal breath sounds. No wheezing, rhonchi or rales.     Comments: Moving air well in all lung fields.  No increased work of breathing. Chest:     Chest wall: No tenderness.  Abdominal:     Tenderness: There is no abdominal tenderness. There is no guarding or rebound.  Lymphadenopathy:     Head:     Right side of head: No submandibular, tonsillar or occipital adenopathy.     Left side of head: No submandibular, tonsillar or  occipital  adenopathy.     Cervical: No cervical adenopathy.   Skin:    Coloration: Skin is not pale.     Findings: No abrasion, erythema, petechiae or rash. Rash is not papular, urticarial or vesicular.   Neurological:     Mental Status: She is alert.   Psychiatric:        Behavior: Behavior is cooperative.      Diagnostic studies: deferred due to insurance stipulations that require a separate visit for testing      Drexel Gentles, MD [facility] 

## 2023-11-02 ENCOUNTER — Encounter: Payer: Self-pay | Admitting: Allergy & Immunology

## 2023-11-02 MED ORDER — NEFFY 2 MG/0.1ML NA SOLN: 1.0000 | NASAL | 1 refills | Status: AC | PRN

## 2023-11-07 ENCOUNTER — Encounter (HOSPITAL_BASED_OUTPATIENT_CLINIC_OR_DEPARTMENT_OTHER): Payer: Self-pay | Admitting: Obstetrics & Gynecology

## 2023-11-20 ENCOUNTER — Ambulatory Visit: Admitting: Allergy & Immunology

## 2023-11-27 ENCOUNTER — Telehealth (HOSPITAL_BASED_OUTPATIENT_CLINIC_OR_DEPARTMENT_OTHER): Payer: Self-pay

## 2023-11-27 NOTE — Telephone Encounter (Signed)
 Called patient to relay the below message that was sent via MyChart per Dr. Cleotilde. LMOM at 9:51 for patient to give the office a call. tbw   Lynn Spears, Just reaching out about surgery scheduling.  It's totally fine if you've changed your mind or decided to see another provider.  My scheduler has tried to call you several times over several months.  Do you mind giving me an update.  Thank you.   Dr. Cleotilde

## 2023-12-11 NOTE — Telephone Encounter (Signed)
 LMOM at 9:54 letting patient know that we are trying to touch base about surgery. tbw

## 2023-12-12 ENCOUNTER — Encounter (HOSPITAL_BASED_OUTPATIENT_CLINIC_OR_DEPARTMENT_OTHER): Payer: Self-pay

## 2024-01-03 ENCOUNTER — Emergency Department (HOSPITAL_BASED_OUTPATIENT_CLINIC_OR_DEPARTMENT_OTHER)

## 2024-01-03 ENCOUNTER — Ambulatory Visit
Admission: EM | Admit: 2024-01-03 | Discharge: 2024-01-03 | Disposition: A | Discharge status: Home / Self Care | Attending: Family Medicine | Admitting: Family Medicine

## 2024-01-03 ENCOUNTER — Encounter (HOSPITAL_BASED_OUTPATIENT_CLINIC_OR_DEPARTMENT_OTHER): Payer: Self-pay

## 2024-01-03 ENCOUNTER — Other Ambulatory Visit: Payer: Self-pay

## 2024-01-03 ENCOUNTER — Emergency Department (HOSPITAL_BASED_OUTPATIENT_CLINIC_OR_DEPARTMENT_OTHER)
Admission: EM | Admit: 2024-01-03 | Discharge: 2024-01-03 | Disposition: A | Discharge status: Home / Self Care | Attending: Emergency Medicine | Admitting: Emergency Medicine

## 2024-01-03 DIAGNOSIS — Z87898 Personal history of other specified conditions: Secondary | ICD-10-CM

## 2024-01-03 DIAGNOSIS — G43819 Other migraine, intractable, without status migrainosus: Secondary | ICD-10-CM | POA: Diagnosis not present

## 2024-01-03 DIAGNOSIS — G43009 Migraine without aura, not intractable, without status migrainosus: Secondary | ICD-10-CM | POA: Insufficient documentation

## 2024-01-03 DIAGNOSIS — R519 Headache, unspecified: Secondary | ICD-10-CM

## 2024-01-03 LAB — CBC WITH DIFFERENTIAL/PLATELET
Abs Immature Granulocytes: 0.02 K/uL (ref 0.00–0.07)
Basophils Absolute: 0 K/uL (ref 0.0–0.1)
Basophils Relative: 1 %
Eosinophils Absolute: 0.3 K/uL (ref 0.0–0.5)
Eosinophils Relative: 4 %
HCT: 39.1 % (ref 36.0–46.0)
Hemoglobin: 12.5 g/dL (ref 12.0–15.0)
Immature Granulocytes: 0 %
Lymphocytes Relative: 28 %
Lymphs Abs: 1.7 K/uL (ref 0.7–4.0)
MCH: 27.7 pg (ref 26.0–34.0)
MCHC: 32 g/dL (ref 30.0–36.0)
MCV: 86.7 fL (ref 80.0–100.0)
Monocytes Absolute: 0.4 K/uL (ref 0.1–1.0)
Monocytes Relative: 7 %
Neutro Abs: 3.6 K/uL (ref 1.7–7.7)
Neutrophils Relative %: 60 %
Platelets: 220 K/uL (ref 150–400)
RBC: 4.51 MIL/uL (ref 3.87–5.11)
RDW: 12.9 % (ref 11.5–15.5)
WBC: 5.9 K/uL (ref 4.0–10.5)
nRBC: 0 % (ref 0.0–0.2)

## 2024-01-03 LAB — BASIC METABOLIC PANEL WITH GFR
Anion gap: 10 (ref 5–15)
BUN: 10 mg/dL (ref 6–20)
CO2: 25 mmol/L (ref 22–32)
Calcium: 9.9 mg/dL (ref 8.9–10.3)
Chloride: 105 mmol/L (ref 98–111)
Creatinine, Ser: 1 mg/dL (ref 0.44–1.00)
GFR, Estimated: >60 mL/min (ref >60)
Glucose, Bld: 100 mg/dL — ABNORMAL HIGH (ref 70–99)
Potassium: 4.3 mmol/L (ref 3.5–5.1)
Sodium: 140 mmol/L (ref 135–145)

## 2024-01-03 LAB — PREGNANCY, URINE: Preg Test, Ur: NEGATIVE

## 2024-01-03 MED ORDER — LACTATED RINGERS IV BOLUS
1000.0000 mL | Freq: Once | INTRAVENOUS | Status: AC
  Administered 2024-01-03 (×2): 1000 mL via INTRAVENOUS

## 2024-01-03 MED ORDER — DIPHENHYDRAMINE HCL 50 MG/ML IJ SOLN
25.0000 mg | Freq: Once | INTRAMUSCULAR | Status: AC
  Administered 2024-01-03 (×2): 25 mg via INTRAVENOUS
  Filled 2024-01-03: qty 1

## 2024-01-03 MED ORDER — PROCHLORPERAZINE EDISYLATE 10 MG/2ML IJ SOLN
10.0000 mg | Freq: Once | INTRAMUSCULAR | Status: AC
  Administered 2024-01-03 (×2): 10 mg via INTRAVENOUS
  Filled 2024-01-03: qty 2

## 2024-01-03 NOTE — ED Notes (Signed)
 Patient is being discharged from the Urgent Care and sent to the Emergency Department via POV . Per Myla Bold NP, patient is in need of higher level of care due to headache. Patient is aware and verbalizes understanding of plan of care.  Vitals:   01/03/24 1703  BP: 134/81  Pulse: (!) 59  Resp: 17  Temp: 98.1 F (36.7 C)  SpO2: 98%

## 2024-01-03 NOTE — Discharge Instructions (Signed)
 Please go to the emergency room for further evaluation and treatment of your reported worst headache of your life

## 2024-01-03 NOTE — ED Provider Notes (Signed)
 [place] [facility] AT [facility] Provider Note   CSN: [id number] Arrival date & time: 01/03/24  1758     Patient presents with: Headache   Monica Lawrence is a 48 y.o. female.    Headache Patient is a 48 year old female to the ED today with concerns for intermittent headache accompanied with photophobia, nausea.  Notes that she originally got seen by urgent care today and was told to come to the ED due to her history of pituitary tumor and description of worst headache of her life.  Upon arrival to the ED though she says that her symptoms have significantly decreased, noting it to be a 4 out of 10.  Particularly worse when standing up and moving head side-to-side.  Reports that Emgality and sumatriptan have not helped.  Denies fever, blurry vision, diplopia, weakness, nose, tingling, chest pain, shortness of breath, abdominal pain, vomiting, dysuria, lower leg swelling.     Prior to Admission medications   Medication Sig Start Date End Date Taking? Authorizing Provider  benzonatate  (TESSALON ) 100 MG capsule Take 1 capsule (100 mg total) by mouth every 8 (eight) hours. 10/24/19   LampteyAleene KIDD, MD  loratadine  (CLARITIN ) 10 MG tablet Take 1 tablet (10 mg total) by mouth daily. 10/24/19   Lamptey, Aleene KIDD, MD  methocarbamol  (ROBAXIN ) 500 MG tablet Take 2 tablets (1,000 mg total) by mouth 4 (four) times daily. 11/05/20   Geiple, Joshua, PA-C  atorvastatin (LIPITOR) 10 MG tablet TK 1 T PO HS 07/22/17 10/10/19  [provider]  fluticasone (FLONASE) 50 MCG/ACT nasal spray 1 spray.  10/10/19  [provider]    Allergies: Ambien [zolpidem tartrate] and Neomycin    Review of Systems  Neurological:  Positive for headaches.  All other systems reviewed and are negative.   Updated Vital Signs BP (!) 140/69   Pulse 62   Temp 98 F (36.7 C)   Resp 18   Ht 5' (1.524 m)   Wt 77.1 kg   LMP 01/01/2024 (Exact Date)   SpO2 100%   BMI 33.20 kg/m    Physical Exam Vitals and nursing note reviewed.  Constitutional:      General: She is not in acute distress.    Appearance: Normal appearance. She is not ill-appearing or diaphoretic.  HENT:     Head: Normocephalic and atraumatic.  Eyes:     General: No scleral icterus.       Left eye: No discharge.     Extraocular Movements: Extraocular movements intact.     Conjunctiva/sclera: Conjunctivae normal.     Comments: Pupil equal and reactive to light to left side pupils are equal and reactive to light on left eye  Cardiovascular:     Rate and Rhythm: Normal rate and regular rhythm.     Pulses: Normal pulses.     Heart sounds: Normal heart sounds. No murmur heard.    No friction rub. No gallop.  Pulmonary:     Effort: Pulmonary effort is normal. No respiratory distress.     Breath sounds: No stridor. No wheezing, rhonchi or rales.  Chest:     Chest wall: No tenderness.  Abdominal:     General: Abdomen is flat. There is no distension.     Palpations: Abdomen is soft.     Tenderness: There is no abdominal tenderness. There is no right CVA tenderness, left CVA tenderness, guarding or rebound.  Musculoskeletal:        General: No swelling, deformity or  signs of injury.     Cervical back: Normal range of motion. No rigidity.     Right lower leg: No edema.     Left lower leg: No edema.  Skin:    General: Skin is warm and dry.     Findings: No bruising, erythema or lesion.  Neurological:     General: No focal deficit present.     Mental Status: She is alert and oriented to person, place, and time. Mental status is at baseline.     Sensory: No sensory deficit.     Motor: No weakness.     Comments: No facial asymmetry, no ataxia, no apraxia, no aphasia, no arm drift, normal coordination with finger-to-nose, normal sensation to both upper and lower extremities bilaterally, normal grip strength bilaterally, normal strength to both flexion and extension to both upper lower extremities 5+  bilaterally, no visual field deficits, no nystagmus.   Psychiatric:        Mood and Affect: Mood normal.     (all labs ordered are listed, but only abnormal results are displayed) Labs Reviewed  BASIC METABOLIC PANEL WITH GFR - Abnormal; Notable for the following components:      Result Value   Glucose, Bld 100 (*)    All other components within normal limits  CBC WITH DIFFERENTIAL/PLATELET  PREGNANCY, URINE    EKG: None  Radiology: CT Head Wo Contrast Result Date: 01/03/2024 CLINICAL DATA:  History of pituitary tumor presenting with migraine. EXAM: CT HEAD WITHOUT CONTRAST TECHNIQUE: Contiguous axial images were obtained from the base of the skull through the vertex without intravenous contrast. RADIATION DOSE REDUCTION: This exam was performed according to the departmental dose-optimization program which includes automated exposure control, adjustment of the mA and/or kV according to patient size and/or use of iterative reconstruction technique. COMPARISON:  None Available. FINDINGS: Brain: No evidence of acute infarction, hemorrhage, hydrocephalus, extra-axial collection or mass lesion/mass effect. 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 Vascular: No hyperdense vessel or unexpected calcification. Skull: Normal. Negative for fracture or focal lesion. Sinuses/Orbits: Postoperative changes are seen involving the right globe. Other: None. IMPRESSION: 1. No acute intracranial abnormality. 2. 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 Electronically Signed   By: Suzen Dials M.D.   On: 01/03/2024 21:47     Procedures   Medications Ordered in the ED  lactated ringers  bolus 1,000 mL (0 mLs Intravenous Stopped 01/03/24 2135)  prochlorperazine  (COMPAZINE ) injection 10 mg (10 mg Intravenous Given 01/03/24 1958)  diphenhydrAMINE  (BENADRYL ) injection 25 mg (25 mg Intravenous Given 01/03/24  1958)    Medical Decision Making Amount and/or Complexity of Data Reviewed Labs: ordered. Radiology: ordered.  Risk Prescription drug management.   This patient is a 48 year old female who presents to the ED for concern of headache, persistent despite migraine medications,.  Medical history of pituitary tumor, followed by neurosurgery and has repeat MRI in 1 year.  On physical exam, patient is in no acute distress, afebrile, alert and orient x 4, speaking in full sentences, nontachypneic, nontachycardic.  Normal neuroexam, with patient notably having a right eye prosthetic.  Unremarkable exam otherwise.  Patient current symptoms, will obtain baseline CT and labs and provide migraine cocktail and reassess.  CT scan shows stable pituitary as well as labs unremarkable.  With current findings, on reevaluation, patient states that show her symptoms have completely abated, no longer experiencing headache after Compazine  and Benadryl  with 1 L of  fluid.  Suspect this is likely migraine headache.  However provide strict turn to ED precautions and told her that MRI may be needed to repeat to further evaluate pituitary gland if she continues to have symptoms.  Patient wished to go home at this time.  Patient vital signs have remained stable throughout the course of patient's time in the ED. Low suspicion for any other emergent pathology at this time. I believe this patient is safe to be discharged. Provided strict return to ER precautions. Patient expressed agreement and understanding of plan. All questions were answered.  Differential diagnoses prior to evaluation: The emergent differential diagnosis includes, but is not limited to, tension headache, migraine, polypharmacy, substance abuse, sinusitis, cervicogenic headache, dehydration, cluster headache, trigeminal neuralgia, IIH, PRES syndrome, intracranial bleed, CVA, pituitary apoplexy. This is not an exhaustive differential.   Past Medical  History / Co-morbidities / Social History: Thyroid disease, pituitary tumor, migraines  Additional history: Chart reviewed. Pertinent results include: Seen by urgent care today reported to have a previous medical history of migraines, pituitary tumor, prosthetic right eye, HLD.  Noted to have developing right sided persistent migraine has been worsening.  She rates the headache as a 10 out of 10, noting it to be worst headache of her life.  Associated with nausea, photophobia and feeling off balanced.   diagnosed with a pituitary tumor in January.  Last seen by neurosurgery on 06/15/2023 reported to have a sellar lesion.  To follow-up with neurosurgery in 1 year for repeat MRI.  Seen yesterday for sore throat.  Provided lidocaine solution at that time.  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  Medications: I ordered medication including Compazine , Benadryl , LR.  I have reviewed the patients home medicines and have made adjustments as needed.  Critical Interventions: None  Social Determinants of Health: Has good follow-up with neurosurgery  Disposition: After consideration of the diagnostic results and the patients response to treatment, I feel that the patient would benefit from discharge and treatment as above.   emergency department workup does not suggest an emergent condition requiring admission or immediate intervention beyond what has been performed at this time. The plan is: Follow-up with PCP and/or neurosurgery as needed, return to the ED for any new or worsening symptoms. The patient is safe for discharge and has been instructed to return immediately for worsening symptoms, change in symptoms  or any other concerns.    Final diagnoses:  Migraine without aura and without status migrainosus, not intractable    ED Discharge Orders     None          Dacia Capers S, PA-C 01/03/24 2202    Patsey Lot, MD 01/03/24 2321

## 2024-01-03 NOTE — ED Triage Notes (Signed)
 Pt present with a migraine x last night. Reports pressure to the rt ear.  Pt states she takes medication for migraines. Denies other symptoms.

## 2024-01-03 NOTE — ED Notes (Signed)
 RN reviewed discharge instructions with pt. Pt verbalized understanding and had no further questions. VSS upon discharge.

## 2024-01-03 NOTE — Discharge Instructions (Addendum)
 You were seen today for migraine headache. Your labs and imaging and physical exam were reassuring that I have low suspicion for any emergent cause of your symptoms today.   We recommend that you continue to follow-up with the primary care for further headache management.  As well as if headaches continue to persist, recommend you continue to follow-up with neurosurgery for reevaluation.  Return to the ED though if you intervene new or worsening symptoms which would include one-sided weakness, uncontrollable headache, worsening vision, nausea and vomiting or confusion.

## 2024-01-03 NOTE — ED Provider Notes (Signed)
 [facility]    CSN: [id number] Arrival date & time: 01/03/24  1650      History   Chief Complaint Chief Complaint  Patient presents with   Headache    HPI Monica Lawrence is a 48 y.o. female with a past medical history of migraines, pituitary tumor, prostatic right eye, hyperlipidemia presents for headache.  Patient reports yesterday she developed a right-sided persistent migraine that has been worsening.  She currently rates her headache as a 10 out of 10 stating it is the worst headache of her life.  It is associated with nausea, photophobia, feeling off balance.  Denies any vomiting, syncope, dizziness, unilateral weakness.  She takes Emgality monthly for her migraines as well as uses sumatriptan for rescue medication.  She is taking her sumatriptan without improvement.  She is also taken Tylenol .  She does states she was diagnosed with a pituitary tumor in January of this year that was discovered after worsening headaches.    Headache Associated symptoms: nausea     Past Medical History:  Diagnosis Date   High cholesterol    Thyroid disease     There are no active problems to display for this patient.   History reviewed. No pertinent surgical history.  OB History   No obstetric history on file.      Home Medications    Prior to Admission medications   Medication Sig Start Date End Date Taking? Authorizing Provider  benzonatate  (TESSALON ) 100 MG capsule Take 1 capsule (100 mg total) by mouth every 8 (eight) hours. 10/24/19   LampteyAleene KIDD, MD  loratadine  (CLARITIN ) 10 MG tablet Take 1 tablet (10 mg total) by mouth daily. 10/24/19   Lamptey, Aleene KIDD, MD  methocarbamol  (ROBAXIN ) 500 MG tablet Take 2 tablets (1,000 mg total) by mouth 4 (four) times daily. 11/05/20   Geiple, Joshua, PA-C  atorvastatin (LIPITOR) 10 MG tablet TK 1 T PO HS 07/22/17 10/10/19  [provider]  fluticasone (FLONASE) 50 MCG/ACT nasal spray 1 spray.  10/10/19  [provider]    Family History Family History  Problem Relation Age of Onset   Healthy Mother    Healthy Father     Social History Social History   Tobacco Use   Smoking status: Never   Smokeless tobacco: Never  Substance Use Topics   Alcohol use: No   Drug use: No     Allergies   Ambien [zolpidem tartrate] and Neomycin   Review of Systems Review of Systems  Gastrointestinal:  Positive for nausea.  Neurological:  Positive for light-headedness and headaches.     Physical Exam Triage Vital Signs ED Triage Vitals  Encounter Vitals Group     BP 01/03/24 1703 134/81     Girls Systolic BP Percentile --      Girls Diastolic BP Percentile --      Boys Systolic BP Percentile --      Boys Diastolic BP Percentile --      Pulse Rate 01/03/24 1703 (!) 59     Resp 01/03/24 1703 17     Temp 01/03/24 1703 98.1 F (36.7 C)     Temp Source 01/03/24 1703 Oral     SpO2 01/03/24 1703 98 %     Weight --      Height --      Head Circumference --      Peak Flow --      Pain Score 01/03/24 1702 10  Pain Loc --      Pain Education --      Exclude from Growth Chart --    No data found.  Updated Vital Signs BP 134/81 (BP Location: Left Arm)   Pulse (!) 59   Temp 98.1 F (36.7 C) (Oral)   Resp 17   LMP 01/01/2024 (Exact Date)   SpO2 98%   Visual Acuity Right Eye Distance:   Left Eye Distance:   Bilateral Distance:    Right Eye Near:   Left Eye Near:    Bilateral Near:     Physical Exam Vitals and nursing note reviewed.  Constitutional:      General: She is not in acute distress.    Appearance: Normal appearance. She is not ill-appearing.  HENT:     Head: Normocephalic and atraumatic.  Eyes:     Extraocular Movements: Extraocular movements intact.     Conjunctiva/sclera: Conjunctivae normal.     Pupils: Pupils are equal, round, and reactive to light.     Comments: Right eye is prostatic  Cardiovascular:     Rate and Rhythm: Regular rhythm. Bradycardia  present.  Pulmonary:     Effort: Pulmonary effort is normal.  Skin:    General: Skin is warm and dry.  Neurological:     General: No focal deficit present.     Mental Status: She is alert and oriented to person, place, and time.     GCS: GCS eye subscore is 4. GCS verbal subscore is 5. GCS motor subscore is 6.     Cranial Nerves: No facial asymmetry.     Motor: No weakness or pronator drift.     Coordination: Romberg sign negative. Finger-Nose-Finger Test normal.     Gait: Tandem walk abnormal.  Psychiatric:        Mood and Affect: Mood normal.        Behavior: Behavior normal.      UC Treatments / Results  Labs (all labs ordered are listed, but only abnormal results are displayed) Labs Reviewed - No data to display  EKG   Radiology No results found.  Procedures Procedures (including critical care time)  Medications Ordered in UC Medications - No data to display  Initial Impression / Assessment and Plan / UC Course  I have reviewed the triage vital signs and the nursing notes.  Pertinent labs & imaging results that were available during my care of the patient were reviewed by me and considered in my medical decision making (see chart for details).      I reviewed exam and symptoms with patient.  Discussed limitations and abilities of urgent care.  Patient presenting with persistent migraine that she rates as worst headache of life.  Given this as well as her recent diagnosis of a pituitary tumor I advise she go to the emergency room for further evaluation/workup/treatment.  She is in agreement with plan and will have her son drive her to the emergency room. Final Clinical Impressions(s) / UC Diagnoses   Final diagnoses:  Worst headache of life  Other migraine without status migrainosus, intractable  History of pituitary tumor     Discharge Instructions      Please go to the emergency room for further evaluation and treatment of your reported worst headache of  your life    ED Prescriptions   None    PDMP not reviewed this encounter.   Loreda Myla SAUNDERS, NP 01/03/24 [phone]

## 2024-01-03 NOTE — ED Triage Notes (Signed)
 Patient arrives POV with headache/migraine that started overnight. Took her prescribed PRN migraine medications. Pain is throbbing and radiating to her ear. Rates her pain a 10/10.

## 2024-04-23 ENCOUNTER — Telehealth (HOSPITAL_BASED_OUTPATIENT_CLINIC_OR_DEPARTMENT_OTHER): Payer: Self-pay

## 2024-04-23 NOTE — Telephone Encounter (Signed)
 Patient called back to see if Dr. Cleotilde was willing to proceed with her surgery. I informed patient that Dr. Cleotilde states she is willing to still do the surgery. Patient wanted to let Dr. Cleotilde know that she has a updated abdominal CT scan in her chart if you would like to take a look at it.   Please send information for surgery scheduling or advise me on what needs to be done for this to be scheduled. tbw

## 2024-04-23 NOTE — Telephone Encounter (Signed)
 Left message to call Nayab Aten at [phone].

## 2024-04-29 NOTE — Telephone Encounter (Signed)
 Left message to call Nayab Aten at [phone].

## 2024-06-03 ENCOUNTER — Ambulatory Visit (HOSPITAL_BASED_OUTPATIENT_CLINIC_OR_DEPARTMENT_OTHER): Admitting: Obstetrics & Gynecology

## 2024-06-03 NOTE — Progress Notes (Unsigned)
" ° °  GYNECOLOGY  VISIT  CC:   No chief complaint on file.   HPI: 49 y.o. G64P1001 Married White or Caucasian female here for surgical consult.  No LMP recorded.  Past Medical History:  Diagnosis Date   Allergic rhinitis    Anemia    04-29-19 patient having iron infusions--[facility]   Angio-edema    Anxiety    Asthma    Chronic headaches    COVID-19 04/2019   Diverticulosis    Elevated prolactin level 09/19/2019   Essential hypertension    Fibroid    Ultrasound 02/12/19 [facility] - left fundal 6.6 x 6.0 x 6.9 cm, left lateral 4.1 x 3.9 x 3.3 cm.    GERD (gastroesophageal reflux disease)    Heart murmur    SVT   Hx of adenomatous colonic polyps    IBS (irritable bowel syndrome)    Palpitations    Seasonal allergies    Sleep apnea    CPAP   Urticaria     MEDS:  Reviewed in EPIC  ALLERGIES: Iron dextran, Biaxin [clarithromycin], Cymbalta  [duloxetine  hcl], Erythromycin, Haemophilus b polysaccharide vaccine, Influenza virus vaccine, Lexapro [escitalopram oxalate], Macrobid [nitrofurantoin], and Penicillins  SH:  ***  ROS  PHYSICAL EXAMINATION:    There were no vitals taken for this visit.    General appearance: alert, cooperative and appears stated age Neck: no adenopathy, supple, symmetrical, trachea midline and thyroid {CHL AMB PHY EX THYROID NORM DEFAULT:[phone]::normal to inspection and palpation} CV:  {Exam; heart brief:31539} Lungs:  {pe lungs ob:314451} Breasts: {Exam; breast:13139::normal appearance, no masses or tenderness} Abdomen: soft, non-tender; bowel sounds normal; no masses,  no organomegaly Lymph:  no inguinal LAD noted  Pelvic: External genitalia:  no lesions              Urethra:  normal appearing urethra with no masses, tenderness or lesions              Bartholins and Skenes: normal                 Vagina: {exam; pelvic vaginal:30846}              Cervix: {CHL AMB PHY EX CERVIX NORM DEFAULT:[phone]::no lesions}               Bimanual Exam:  Uterus:  {CHL AMB PHY EX UTERUS NORM DEFAULT:[phone]::normal size, contour, position, consistency, mobility, non-tender}              Adnexa: {CHL AMB PHY EX ADNEXA NO MASS DEFAULT:[phone]::no mass, fullness, tenderness}              Rectovaginal: {yes no:[id number]}.  Confirms.              Anus:  normal sphincter tone, no lesions  Chaperone was present for exam.  Assessment/Plan: There are no diagnoses linked to this encounter.  "

## 2024-07-05 ENCOUNTER — Encounter (HOSPITAL_BASED_OUTPATIENT_CLINIC_OR_DEPARTMENT_OTHER): Admitting: Radiology

## 2024-07-05 DIAGNOSIS — Z1231 Encounter for screening mammogram for malignant neoplasm of breast: Secondary | ICD-10-CM

## 2024-07-10 ENCOUNTER — Ambulatory Visit (HOSPITAL_BASED_OUTPATIENT_CLINIC_OR_DEPARTMENT_OTHER): Payer: Self-pay | Admitting: Obstetrics & Gynecology
